# Patient Record
Sex: Male | Born: 1955
Health system: Southern US, Community
[De-identification: ages and names within clinical notes are randomized; demographics above are authoritative.]

## PROBLEM LIST (undated history)

## (undated) DIAGNOSIS — E119 Type 2 diabetes mellitus without complications: Secondary | ICD-10-CM

## (undated) DIAGNOSIS — E785 Hyperlipidemia, unspecified: Secondary | ICD-10-CM

## (undated) DIAGNOSIS — R011 Cardiac murmur, unspecified: Secondary | ICD-10-CM

## (undated) DIAGNOSIS — F419 Anxiety disorder, unspecified: Secondary | ICD-10-CM

## (undated) DIAGNOSIS — I10 Essential (primary) hypertension: Secondary | ICD-10-CM

## (undated) HISTORY — DX: Cardiac murmur, unspecified: R01.1

## (undated) HISTORY — DX: Type 2 diabetes mellitus without complications: E11.9

## (undated) HISTORY — DX: Anxiety disorder, unspecified: F41.9

## (undated) HISTORY — DX: Hyperlipidemia, unspecified: E78.5

## (undated) HISTORY — PX: TONSILLECTOMY: SUR1361

## (undated) HISTORY — DX: Essential (primary) hypertension: I10

---

## 2012-03-30 ENCOUNTER — Observation Stay (HOSPITAL_COMMUNITY)
Admission: EM | Admit: 2012-03-30 | Discharge: 2012-03-31 | Disposition: A | Discharge status: Home / Self Care | Payer: 59 | Attending: General Surgery | Admitting: General Surgery

## 2012-03-30 ENCOUNTER — Emergency Department (HOSPITAL_COMMUNITY): Payer: 59 | Admitting: Anesthesiology

## 2012-03-30 ENCOUNTER — Ambulatory Visit (HOSPITAL_COMMUNITY)
Admission: RE | Admit: 2012-03-30 | Discharge: 2012-03-30 | Disposition: A | Discharge status: Home / Self Care | Payer: 59 | Source: Ambulatory Visit | Attending: Family Medicine | Admitting: Family Medicine

## 2012-03-30 ENCOUNTER — Encounter (HOSPITAL_COMMUNITY): Payer: Self-pay | Admitting: Anesthesiology

## 2012-03-30 ENCOUNTER — Encounter (HOSPITAL_COMMUNITY): Payer: Self-pay | Admitting: Emergency Medicine

## 2012-03-30 ENCOUNTER — Emergency Department (HOSPITAL_COMMUNITY): Payer: 59

## 2012-03-30 ENCOUNTER — Encounter (HOSPITAL_COMMUNITY)
Admission: EM | Disposition: A | Discharge status: Home / Self Care | Payer: Self-pay | Source: Home / Self Care | Attending: Emergency Medicine

## 2012-03-30 ENCOUNTER — Ambulatory Visit (INDEPENDENT_AMBULATORY_CARE_PROVIDER_SITE_OTHER): Payer: 59 | Admitting: Family Medicine

## 2012-03-30 DIAGNOSIS — R109 Unspecified abdominal pain: Secondary | ICD-10-CM | POA: Insufficient documentation

## 2012-03-30 DIAGNOSIS — I10 Essential (primary) hypertension: Secondary | ICD-10-CM | POA: Insufficient documentation

## 2012-03-30 DIAGNOSIS — K625 Hemorrhage of anus and rectum: Secondary | ICD-10-CM

## 2012-03-30 DIAGNOSIS — K3580 Unspecified acute appendicitis: Principal | ICD-10-CM | POA: Insufficient documentation

## 2012-03-30 DIAGNOSIS — R509 Fever, unspecified: Secondary | ICD-10-CM | POA: Insufficient documentation

## 2012-03-30 HISTORY — PX: APPENDECTOMY: SHX54

## 2012-03-30 HISTORY — PX: LAPAROSCOPIC APPENDECTOMY: SHX408

## 2012-03-30 LAB — COMPREHENSIVE METABOLIC PANEL
Albumin: 4.2 g/dL (ref 3.5–5.2)
Alkaline Phosphatase: 83 U/L (ref 39–117)
BUN: 16 mg/dL (ref 6–23)
CO2: 27 mEq/L (ref 19–32)
Glucose, Bld: 163 mg/dL — ABNORMAL HIGH (ref 70–99)
Potassium: 3.4 mEq/L — ABNORMAL LOW (ref 3.5–5.3)

## 2012-03-30 LAB — POCT CBC
Granulocyte percent: 62.4 % (ref 37–80)
HCT, POC: 48.8 % (ref 43.5–53.7)
Hemoglobin: 15.9 g/dL (ref 14.1–18.1)
Lymph, poc: 3.7 — AB (ref 0.6–3.4)
MCH, POC: 30.5 pg (ref 27–31.2)
MCHC: 32.6 g/dL (ref 31.8–35.4)
MCV: 93.4 fL (ref 80–97)
MID (cbc): 0.7 (ref 0–0.9)
MPV: 9.9 fL (ref 0–99.8)
POC Granulocyte: 7.3 — AB (ref 2–6.9)
POC LYMPH PERCENT: 31.8 %L (ref 10–50)
POC MID %: 5.8 % (ref 0–12)
Platelet Count, POC: 230 10*3/uL (ref 142–424)
RBC: 5.22 M/uL (ref 4.69–6.13)
RDW, POC: 14.7 %
WBC: 11.7 10*3/uL — AB (ref 4.6–10.2)

## 2012-03-30 LAB — COMPREHENSIVE METABOLIC PANEL WITH GFR
ALT: 19 U/L (ref 0–53)
AST: 23 U/L (ref 0–37)
Calcium: 9 mg/dL (ref 8.4–10.5)
Chloride: 101 meq/L (ref 96–112)
Creat: 1.27 mg/dL (ref 0.50–1.35)
Sodium: 138 meq/L (ref 135–145)
Total Bilirubin: 0.8 mg/dL (ref 0.3–1.2)
Total Protein: 7.1 g/dL (ref 6.0–8.3)

## 2012-03-30 LAB — IFOBT (OCCULT BLOOD): IFOBT: NEGATIVE

## 2012-03-30 SURGERY — Surgical Case
Anesthesia: *Unknown

## 2012-03-30 SURGERY — APPENDECTOMY, LAPAROSCOPIC
Anesthesia: General | Site: Abdomen | Wound class: Contaminated

## 2012-03-30 MED ORDER — ONDANSETRON HCL 4 MG/2ML IJ SOLN
INTRAMUSCULAR | Status: DC | PRN
  Administered 2012-03-30: 4 mg via INTRAVENOUS

## 2012-03-30 MED ORDER — ONDANSETRON HCL 4 MG/2ML IJ SOLN: 4.0000 mg | Freq: Four times a day (QID) | INTRAMUSCULAR | Status: DC | PRN

## 2012-03-30 MED ORDER — MIDAZOLAM HCL 5 MG/5ML IJ SOLN
INTRAMUSCULAR | Status: DC | PRN
  Administered 2012-03-30: 2 mg via INTRAVENOUS

## 2012-03-30 MED ORDER — SUCCINYLCHOLINE CHLORIDE 20 MG/ML IJ SOLN
INTRAMUSCULAR | Status: DC | PRN
  Administered 2012-03-30: 100 mg via INTRAVENOUS

## 2012-03-30 MED ORDER — LISINOPRIL-HYDROCHLOROTHIAZIDE 10-12.5 MG PO TABS: 1.0000 | ORAL_TABLET | Freq: Every day | ORAL | Status: DC

## 2012-03-30 MED ORDER — HYDROMORPHONE HCL PF 1 MG/ML IJ SOLN: 0.5000 mg | INTRAMUSCULAR | Status: DC | PRN

## 2012-03-30 MED ORDER — GLYCOPYRROLATE 0.2 MG/ML IJ SOLN
INTRAMUSCULAR | Status: DC | PRN
  Administered 2012-03-30: .8 mg via INTRAVENOUS

## 2012-03-30 MED ORDER — IOHEXOL 300 MG/ML  SOLN: 20.0000 mL | INTRAMUSCULAR | Status: AC

## 2012-03-30 MED ORDER — EPHEDRINE SULFATE 50 MG/ML IJ SOLN
INTRAMUSCULAR | Status: DC | PRN
  Administered 2012-03-30: 5 mg via INTRAVENOUS
  Administered 2012-03-30: 10 mg via INTRAVENOUS

## 2012-03-30 MED ORDER — IOHEXOL 300 MG/ML  SOLN
100.0000 mL | Freq: Once | INTRAMUSCULAR | Status: AC | PRN
  Administered 2012-03-30: 100 mL via INTRAVENOUS

## 2012-03-30 MED ORDER — MORPHINE SULFATE 4 MG/ML IJ SOLN: 0.0500 mg/kg | INTRAMUSCULAR | Status: DC | PRN

## 2012-03-30 MED ORDER — OXYCODONE-ACETAMINOPHEN 5-325 MG PO TABS: 1.0000 | ORAL_TABLET | ORAL | Status: DC | PRN

## 2012-03-30 MED ORDER — SODIUM CHLORIDE 0.9 % IR SOLN
Status: DC | PRN
  Administered 2012-03-30: 1000 mL

## 2012-03-30 MED ORDER — ACETAMINOPHEN 325 MG PO TABS: 650.0000 mg | ORAL_TABLET | ORAL | Status: DC | PRN

## 2012-03-30 MED ORDER — ENOXAPARIN SODIUM 40 MG/0.4ML ~~LOC~~ SOLN
40.0000 mg | SUBCUTANEOUS | Status: DC
  Filled 2012-03-30: qty 0.4

## 2012-03-30 MED ORDER — LACTATED RINGERS IV SOLN
INTRAVENOUS | Status: DC | PRN
  Administered 2012-03-30: 18:00:00 via INTRAVENOUS

## 2012-03-30 MED ORDER — SODIUM CHLORIDE 0.9 % IV SOLN
INTRAVENOUS | Status: DC | PRN
  Administered 2012-03-30 (×2): via INTRAVENOUS

## 2012-03-30 MED ORDER — FENTANYL CITRATE 0.05 MG/ML IJ SOLN
INTRAMUSCULAR | Status: DC | PRN
  Administered 2012-03-30: 100 ug via INTRAVENOUS

## 2012-03-30 MED ORDER — SODIUM CHLORIDE 0.9 % IV SOLN
1.0000 g | Freq: Once | INTRAVENOUS | Status: AC
  Administered 2012-03-30: 1 g via INTRAVENOUS
  Filled 2012-03-30: qty 1

## 2012-03-30 MED ORDER — PROPOFOL 10 MG/ML IV BOLUS
INTRAVENOUS | Status: DC | PRN
  Administered 2012-03-30: 160 mg via INTRAVENOUS

## 2012-03-30 MED ORDER — BUPIVACAINE-EPINEPHRINE 0.25% -1:200000 IJ SOLN
INTRAMUSCULAR | Status: DC | PRN
  Administered 2012-03-30: 14 mL

## 2012-03-30 MED ORDER — NEOSTIGMINE METHYLSULFATE 1 MG/ML IJ SOLN
INTRAMUSCULAR | Status: DC | PRN
  Administered 2012-03-30: 5 mg via INTRAVENOUS

## 2012-03-30 MED ORDER — ONDANSETRON HCL 4 MG PO TABS: 4.0000 mg | ORAL_TABLET | Freq: Four times a day (QID) | ORAL | Status: DC | PRN

## 2012-03-30 MED ORDER — KCL IN DEXTROSE-NACL 20-5-0.45 MEQ/L-%-% IV SOLN
INTRAVENOUS | Status: DC
  Administered 2012-03-30: 21:00:00 via INTRAVENOUS
  Filled 2012-03-30 (×3): qty 1000

## 2012-03-30 MED ORDER — HYDROMORPHONE HCL PF 1 MG/ML IJ SOLN
0.2500 mg | INTRAMUSCULAR | Status: DC | PRN
  Administered 2012-03-30 (×2): 0.5 mg via INTRAVENOUS

## 2012-03-30 MED ORDER — ROCURONIUM BROMIDE 100 MG/10ML IV SOLN
INTRAVENOUS | Status: DC | PRN
  Administered 2012-03-30: 40 mg via INTRAVENOUS

## 2012-03-30 SURGICAL SUPPLY — 49 items
APPLIER CLIP ROT 10 11.4 M/L (STAPLE)
BLADE SURG ROTATE 9660 (MISCELLANEOUS) IMPLANT
CANISTER SUCTION 2500CC (MISCELLANEOUS) ×2 IMPLANT
CHLORAPREP W/TINT 26ML (MISCELLANEOUS) ×2 IMPLANT
CLIP APPLIE ROT 10 11.4 M/L (STAPLE) IMPLANT
CLOTH BEACON ORANGE TIMEOUT ST (SAFETY) ×2 IMPLANT
COVER SURGICAL LIGHT HANDLE (MISCELLANEOUS) ×2 IMPLANT
CUTTER FLEX LINEAR 45M (STAPLE) ×2 IMPLANT
CUTTER LINEAR ENDO 35 ETS (STAPLE) IMPLANT
CUTTER LINEAR ENDO 35 ETS TH (STAPLE) IMPLANT
DECANTER SPIKE VIAL GLASS SM (MISCELLANEOUS) IMPLANT
DERMABOND ADHESIVE PROPEN (GAUZE/BANDAGES/DRESSINGS) ×1
DERMABOND ADVANCED (GAUZE/BANDAGES/DRESSINGS) ×1
DERMABOND ADVANCED .7 DNX12 (GAUZE/BANDAGES/DRESSINGS) ×1 IMPLANT
DERMABOND ADVANCED .7 DNX6 (GAUZE/BANDAGES/DRESSINGS) ×1 IMPLANT
DRAPE UTILITY 15X26 W/TAPE STR (DRAPE) ×4 IMPLANT
DRSG TEGADERM 4X4.75 (GAUZE/BANDAGES/DRESSINGS) ×2 IMPLANT
ELECT REM PT RETURN 9FT ADLT (ELECTROSURGICAL) ×2
ELECTRODE REM PT RTRN 9FT ADLT (ELECTROSURGICAL) ×1 IMPLANT
ENDOLOOP SUT PDS II  0 18 (SUTURE)
ENDOLOOP SUT PDS II 0 18 (SUTURE) IMPLANT
GAUZE SPONGE 2X2 8PLY STRL LF (GAUZE/BANDAGES/DRESSINGS) ×1 IMPLANT
GLOVE BIOGEL PI IND STRL 8 (GLOVE) ×1 IMPLANT
GLOVE BIOGEL PI INDICATOR 8 (GLOVE) ×1
GLOVE ECLIPSE 7.5 STRL STRAW (GLOVE) ×2 IMPLANT
GOWN STRL NON-REIN LRG LVL3 (GOWN DISPOSABLE) ×4 IMPLANT
KIT BASIN OR (CUSTOM PROCEDURE TRAY) ×2 IMPLANT
KIT ROOM TURNOVER OR (KITS) ×2 IMPLANT
NS IRRIG 1000ML POUR BTL (IV SOLUTION) ×2 IMPLANT
PAD ARMBOARD 7.5X6 YLW CONV (MISCELLANEOUS) ×4 IMPLANT
PENCIL BUTTON HOLSTER BLD 10FT (ELECTRODE) ×2 IMPLANT
POUCH SPECIMEN RETRIEVAL 10MM (ENDOMECHANICALS) ×2 IMPLANT
RELOAD /EVU35 (ENDOMECHANICALS) IMPLANT
RELOAD 45 VASCULAR/THIN (ENDOMECHANICALS) ×2 IMPLANT
RELOAD CUTTER ETS 35MM STAND (ENDOMECHANICALS) IMPLANT
RELOAD STAPLE TA45 3.5 REG BLU (ENDOMECHANICALS) ×2 IMPLANT
SET IRRIG TUBING LAPAROSCOPIC (IRRIGATION / IRRIGATOR) ×2 IMPLANT
SPECIMEN JAR SMALL (MISCELLANEOUS) ×2 IMPLANT
SPONGE GAUZE 2X2 STER 10/PKG (GAUZE/BANDAGES/DRESSINGS) ×1
STRIP CLOSURE SKIN 1/2X4 (GAUZE/BANDAGES/DRESSINGS) ×2 IMPLANT
SUT MNCRL AB 4-0 PS2 18 (SUTURE) ×2 IMPLANT
TOWEL OR 17X24 6PK STRL BLUE (TOWEL DISPOSABLE) ×2 IMPLANT
TOWEL OR 17X26 10 PK STRL BLUE (TOWEL DISPOSABLE) ×2 IMPLANT
TRAY FOLEY CATH 14FR (SET/KITS/TRAYS/PACK) ×2 IMPLANT
TRAY LAPAROSCOPIC (CUSTOM PROCEDURE TRAY) ×2 IMPLANT
TROCAR XCEL 12X100 BLDLESS (ENDOMECHANICALS) ×2 IMPLANT
TROCAR XCEL BLUNT TIP 100MML (ENDOMECHANICALS) ×2 IMPLANT
TROCAR XCEL NON-BLD 5MMX100MML (ENDOMECHANICALS) ×2 IMPLANT
WATER STERILE IRR 1000ML POUR (IV SOLUTION) IMPLANT

## 2012-03-30 NOTE — Anesthesia Preprocedure Evaluation (Addendum)
Anesthesia Evaluation  Patient identified by MRN, date of birth, ID band  Reviewed: Allergy & Precautions, H&P , NPO status , Patient's Chart, lab work & pertinent test results  Airway Mallampati: II      Dental   Pulmonary neg pulmonary ROS,          Cardiovascular hypertension, Pt. on medications + Valvular Problems/Murmurs Rhythm:Regular Rate:Normal + Systolic murmurs Followed in past by [facility]. Hx of heart murmur and HTN dx as per patient two years ago. Patient reports medical treatment and cardiology referral. Cardiology referral not done per patient. Patient denies SOB and chest pain.  Case discussed with patient and Dr. Lindie Spruce.   Neuro/Psych negative neurological ROS     GI/Hepatic Neg liver ROS,   Endo/Other  negative endocrine ROS  Renal/GU negative Renal ROS     Musculoskeletal negative musculoskeletal ROS (+)   Abdominal   Peds  Hematology negative hematology ROS (+)   Anesthesia Other Findings   Reproductive/Obstetrics                         Anesthesia Physical Anesthesia Plan  ASA: II  Anesthesia Plan: General   Post-op Pain Management:    Induction: Intravenous, Rapid sequence and Cricoid pressure planned  Airway Management Planned: Oral ETT  Additional Equipment:   Intra-op Plan:   Post-operative Plan: Extubation in OR  Informed Consent:   Plan Discussed with: CRNA  Anesthesia Plan Comments:         Anesthesia Quick Evaluation

## 2012-03-30 NOTE — ED Notes (Signed)
Patient advises that he last ate around 5:30am.

## 2012-03-30 NOTE — Op Note (Signed)
OPERATIVE REPORT  DATE OF OPERATION: 03/30/2012  PATIENT:  Dellie Catholic  56 y.o. male  PRE-OPERATIVE DIAGNOSIS:  Acute Appendicitis  POST-OPERATIVE DIAGNOSIS:  Acute Appendicitis with gangrenous necrosis  PROCEDURE:  Procedure(s): APPENDECTOMY LAPAROSCOPIC  SURGEON:  Surgeon(s): Cherylynn Ridges, MD  ASSISTANT: None  ANESTHESIA:   general  EBL: <20 ml  BLOOD ADMINISTERED: none  DRAINS: none   SPECIMEN:  Source of Specimen:  appendix  COUNTS CORRECT:  YES  PROCEDURE DETAILS: The patient was taken to the operating room and placed on the table in the supine position. After an adequate endotracheal anesthetic was administered she was prepped and draped in usual sterile manner exposing his entire abdomen.  After a proper time out was performed identifying the patient and the procedure be performed a supraumbilical transverse curvilinear incision was made using #15 blade.  The patient had and umbilical hernia defect which was dissected clear, the sac was resected, a 0 Vicryl pursestring was place around the defect that was subsequently used as access for the [facility] cannula. It was through the [facility] cannula and the carbon dioxide gas was insufflated into the peritoneal cavity up to a maximal intra-abdominal pressure of 15 mm of mercury.  Once this was done a right upper quadrant 5 mm cannula and the left lower quadrant 12 mm cannula passed under direct vision the patient was placed in Trendelenburg left side was tilted down.  The patient had some adhesions of the cecum and the retrocecal appendix to the lateral and anterior abdominal wall.  This was dissected away bluntly. The appendix was rotated laterally and retroperitoneal. We were able to dissect from the lateral and posterior wall and then come across the base using an Endo GIA blue 3.5 mm  stapler.  The mesoappendix was taken with a white 2.5 mm Endo GIA. This completed attached appendix and the 2 portions were removed by Endo  Catch bag.  There was excellent hemostasis. We irrigated with saline solution and removed all cannulas tying off the supraumbilical fascia/hernia using the pursestring suture which held in the [place] cannula..  Once all cannulas were removed we injected all sites with quarter percent Marcaine with epinephrine. Once that was done the left lower quadrant and supraumbilical skin sites were closed using running subcuticular stitch of 4-0 Monocryl. Once that was done Dermabond Steri-Strips and Tegaderms use complete our dressings. All needle counts sponge counts and instrument counts were correct    PATIENT DISPOSITION:  PACU - hemodynamically stable.   Kaisyn Millea III,Javeon Macmurray O 5/11/20136:27 PM

## 2012-03-30 NOTE — ED Notes (Signed)
Pt. Stated, I was dx with appendicitis after going to xray.

## 2012-03-30 NOTE — Transfer of Care (Signed)
Immediate Anesthesia Transfer of Care Note  Patient: Michael Stephenson  Procedure(s) Performed: Procedure(s) (LRB): APPENDECTOMY LAPAROSCOPIC (N/A)  Patient Location: PACU  Anesthesia Type: General  Level of Consciousness: sedated  Airway & Oxygen Therapy: Patient connected to nasal cannula oxygen  Post-op Assessment: Report given to PACU RN, Post -op Vital signs reviewed and stable and Patient moving all extremities X 4  Post vital signs: Reviewed and stable  Complications: No apparent anesthesia complications

## 2012-03-30 NOTE — Progress Notes (Addendum)
[facility]:  Office Visit  Chief Complaint:  Chief Complaint  Patient presents with  . Abdominal Pain    since last night one week ago bloody stool     HPI: Michael Stephenson is a 56 y.o. male who complains of  1 day history of acute right lower quadrant abd pain, subjective fevers/chills. Not asscoiated with food but has had poor intake. He has increase pain with minimal movement. 1 week ago he had BRBPR in toilet bowel and has had intermittent black stools.  Denies any h/o IBD, colon cancer. He has had no abdominla surgeries, still has appendix. NO h/o constipation. BM one day ago, + flatus.   Past Medical History  Diagnosis Date  . Hypertension    History reviewed. No pertinent past surgical history. History   Social History  . Marital Status: Divorced    Spouse Name: N/A    Number of Children: N/A  . Years of Education: N/A   Social History Main Topics  . Smoking status: Former Games developer  . Smokeless tobacco: None  . Alcohol Use: None  . Drug Use: None  . Sexually Active: None   Other Topics Concern  . None   Social History Narrative  . None   Family History  Problem Relation Age of Onset  . Cancer Father    No Known Allergies Prior to Admission medications   Not on File     ROS: The patient denies fevers, chills, night sweats, unintentional weight loss, chest pain, palpitations, wheezing, dyspnea on exertion, nausea, vomiting, dysuria, hematuria, numbness, weakness, or tingling.  + abdominal pain, + melena All other systems have been reviewed and were otherwise negative with the exception of those mentioned in the HPI and as above.    PHYSICAL EXAM: Filed Vitals:   03/30/12 0845  BP: 182/103  Pulse: 82  Temp: 100 F (37.8 C)  Resp: 16   Filed Vitals:   03/30/12 0845  Height: 5\' 9"  (1.753 m)  Weight: 209 lb (94.802 kg)   Body mass index is 30.86 kg/(m^2).  General: Alert, no acute distress HEENT:  Normocephalic, atraumatic,  oropharynx patent.  Cardiovascular:  Regular rate and rhythm, no rubs murmurs or gallops.  No Carotid bruits, radial pulse intact. No pedal edema.  Respiratory: Clear to auscultation bilaterally.  No wheezes, rales, or rhonchi.  No cyanosis, no use of accessory musculature GI: No organomegaly, abdomen is soft and + RLQ tender, no guarding, no peritoneal sign, positive bowel sounds.  No masses. Skin: No rashes. Neurologic: Facial musculature symmetric. Psychiatric: Patient is appropriate throughout our interaction. Lymphatic: No cervical lymphadenopathy Musculoskeletal: Gait intact.   LABS: Results for orders placed in visit on 03/30/12  POCT CBC      Component Value Range   WBC 11.7 (*) 4.6 - 10.2 (K/uL)   Lymph, poc 3.7 (*) 0.6 - 3.4    POC LYMPH PERCENT 31.8  10 - 50 (%L)   MID (cbc) 0.7  0 - 0.9    POC MID % 5.8  0 - 12 (%M)   POC Granulocyte 7.3 (*) 2 - 6.9    Granulocyte percent 62.4  37 - 80 (%G)   RBC 5.22  4.69 - 6.13 (M/uL)   Hemoglobin 15.9  14.1 - 18.1 (g/dL)   HCT, POC 16.1  09.6 - 53.7 (%)   MCV 93.4  80 - 97 (fL)   MCH, POC 30.5  27 - 31.2 (pg)   MCHC 32.6  31.8 -  35.4 (g/dL)   RDW, POC 16.1     Platelet Count, POC 230  142 - 424 (K/uL)   MPV 9.9  0 - 99.8 (fL)  IFOBT (OCCULT BLOOD)      Component Value Range   IFOBT Negative       EKG/XRAY:   Primary read interpreted by Dr. Conley Rolls at [facility].   ASSESSMENT/PLAN: Encounter Diagnoses  Name Primary?  . Abdominal pain Yes  . Bright red blood per rectum   . HTN (hypertension)    1. Sent patient to get CT abd and pelvis to rule out appendicitis 2. Hemoccult negative but will send to GI after get CT results 3. Rx Lisinopril/HCTZ 10/12.5 mg daily, return in 3 days fro f/u with BP measurements. Advise SEs of meds.     Michael Gisler PHUONG, DO 03/30/2012 10:13 AM

## 2012-03-30 NOTE — Anesthesia Postprocedure Evaluation (Signed)
  Anesthesia Post-op Note  Patient: Michael Stephenson  Procedure(s) Performed: Procedure(s) (LRB): APPENDECTOMY LAPAROSCOPIC (N/A)  Patient Location: PACU  Anesthesia Type: General  Level of Consciousness: awake  Airway and Oxygen Therapy: Patient Spontanous Breathing  Post-op Pain: mild  Post-op Assessment: Post-op Vital signs reviewed  Post-op Vital Signs: Reviewed  Complications: No apparent anesthesia complications

## 2012-03-30 NOTE — ED Provider Notes (Signed)
History     CSN: [id number]  Arrival date & time 03/30/12  1329   First MD Initiated Contact with Patient 03/30/12 1405      Chief Complaint  Patient presents with  . Abdominal Pain    (Consider location/radiation/quality/duration/timing/severity/associated sxs/prior treatment) HPI Comments: Was seen at [facility], had ct confirming appendicitis, sent here for surgical consultation.  Patient is a 56 y.o. male presenting with abdominal pain. The history is provided by the patient.  Abdominal Pain The primary symptoms of the illness include abdominal pain and fever. The primary symptoms of the illness do not include nausea, vomiting, diarrhea or dysuria. The current episode started yesterday. The onset of the illness was gradual. The problem has been gradually worsening.  The patient has not had a change in bowel habit. Symptoms associated with the illness do not include chills.    Past Medical History  Diagnosis Date  . Hypertension     History reviewed. No pertinent past surgical history.  Family History  Problem Relation Age of Onset  . Cancer Father     History  Substance Use Topics  . Smoking status: Former Games developer  . Smokeless tobacco: Not on file  . Alcohol Use: No      Review of Systems  Constitutional: Positive for fever. Negative for chills.  Gastrointestinal: Positive for abdominal pain. Negative for nausea, vomiting and diarrhea.  Genitourinary: Negative for dysuria.  All other systems reviewed and are negative.    Allergies  Review of patient's allergies indicates no known allergies.  Home Medications   Current Outpatient Rx  Name Route Sig Dispense Refill  . BISMUTH SUBSALICYLATE 262 MG/15ML PO SUSP Oral Take 15 mLs by mouth every 6 (six) hours as needed. For indigestion      BP 181/102  Pulse 71  SpO2 98%  Physical Exam  Nursing note and vitals reviewed. Constitutional: He is oriented to person, place, and time. He appears well-developed  and well-nourished. No distress.  HENT:  Head: Normocephalic and atraumatic.  Neck: Normal range of motion. Neck supple.  Cardiovascular: Normal rate and regular rhythm.   No murmur heard. Pulmonary/Chest: Effort normal and breath sounds normal. No respiratory distress. He has no wheezes.  Abdominal: Soft. Bowel sounds are normal. He exhibits distension.       ttp in the rlq, no rebound or guarding.  Musculoskeletal: Normal range of motion. He exhibits no edema.  Neurological: He is alert and oriented to person, place, and time.  Skin: Skin is warm. He is not diaphoretic.    ED Course  Procedures (including critical care time)  Labs Reviewed - No data to display Ct Abdomen Pelvis W Contrast  03/30/2012  *RADIOLOGY REPORT*  Clinical Data: Lower abdominal pain, fever and bloody stools.  CT ABDOMEN AND PELVIS WITH CONTRAST  Technique:  Multidetector CT imaging of the abdomen and pelvis was performed following the standard protocol during bolus administration of intravenous contrast.  Contrast: OMNIPAQUE IOHEXOL 300 MG/ML  SOLN  Comparison: None.  Findings: There is evidence of acute appendicitis with significant inflammatory changes seen surrounding the appendix. A calcified appendicolith is present in the mid appendix and the distal appendix is dilated, measuring approximately 11 mm in diameter. Based on CT appearance, focal rupture of the appendix is likely. There is no evidence of focal abscess or extraluminal air.  Other bowel loops are unremarkable without evidence of obstruction or ileus.  The liver, gallbladder, pancreas, spleen, adrenal glands and kidneys are within normal limits.  No evidence of hernia.  The bladder is unremarkable.  No incidental masses or enlarged lymph nodes.  Bony structures are within normal limits.  IMPRESSION: Acute appendicitis with a calcified phlebolith present in the midportion of the appendix.  The distal appendix is dilated and significant surrounding  inflammatory changes are present.  This likely represents focal ruptured appendicitis.  There is no evidence of peri-appendiceal abscess.  Findings were called to Dr. Conley Rolls at the time of interpretation.  Original Report Authenticated By: Reola Calkins, M.D.     No diagnosis found.    MDM  He was given invanz and I have consulted Dr. Lindie Spruce who will take the patient to the OR.        Geoffery Lyons, MD 03/30/12 [phone]

## 2012-03-30 NOTE — Preoperative (Signed)
Beta Blockers   Reason not to administer Beta Blockers:Not Applicable 

## 2012-03-30 NOTE — Anesthesia Procedure Notes (Signed)
Procedure Name: Intubation Date/Time: 03/30/2012 5:17 PM Performed by: Molli Hazard Pre-anesthesia Checklist: Patient identified, Emergency Drugs available, Suction available and Patient being monitored Patient Re-evaluated:Patient Re-evaluated prior to inductionOxygen Delivery Method: Circle system utilized Preoxygenation: Pre-oxygenation with 100% oxygen Intubation Type: IV induction, Rapid sequence and Cricoid Pressure applied Laryngoscope Size: Miller and 2 Grade View: Grade I Tube type: Oral Tube size: 7.5 mm Number of attempts: 1 Airway Equipment and Method: Stylet Placement Confirmation: ETT inserted through vocal cords under direct vision,  positive ETCO2 and breath sounds checked- equal and bilateral Secured at: 24 cm Tube secured with: Tape Dental Injury: Teeth and Oropharynx as per pre-operative assessment

## 2012-03-30 NOTE — H&P (Signed)
Michael Stephenson is an 56 y.o. male.   Chief Complaint: Abdominal pain, sent over from urgent care. HPI: Generalized abdominal pain, localized to RLQ now, start Friday morning.  CT demonstrates acute appendicitis  Past Medical History  Diagnosis Date  . Hypertension     History reviewed. No pertinent past surgical history.  Family History  Problem Relation Age of Onset  . Cancer Father    Social History:  reports that he has quit smoking. He does not have any smokeless tobacco history on file. He reports that he does not drink alcohol or use illicit drugs.  Allergies: No Known Allergies   (Not in a hospital admission)  Results for orders placed in visit on 03/30/12 (from the past 48 hour(s))  POCT CBC     Status: Abnormal   Collection Time   03/30/12  9:34 AM      Component Value Range Comment   WBC 11.7 (*) 4.6 - 10.2 (K/uL)    Lymph, poc 3.7 (*) 0.6 - 3.4     POC LYMPH PERCENT 31.8  10 - 50 (%L)    MID (cbc) 0.7  0 - 0.9     POC MID % 5.8  0 - 12 (%M)    POC Granulocyte 7.3 (*) 2 - 6.9     Granulocyte percent 62.4  37 - 80 (%G)    RBC 5.22  4.69 - 6.13 (M/uL)    Hemoglobin 15.9  14.1 - 18.1 (g/dL)    HCT, POC 16.1  09.6 - 53.7 (%)    MCV 93.4  80 - 97 (fL)    MCH, POC 30.5  27 - 31.2 (pg)    MCHC 32.6  31.8 - 35.4 (g/dL)    RDW, POC 04.5      Platelet Count, POC 230  142 - 424 (K/uL)    MPV 9.9  0 - 99.8 (fL)   IFOBT (OCCULT BLOOD)     Status: Normal   Collection Time   03/30/12  9:34 AM      Component Value Range Comment   IFOBT Negative      Ct Abdomen Pelvis W Contrast  03/30/2012  *RADIOLOGY REPORT*  Clinical Data: Lower abdominal pain, fever and bloody stools.  CT ABDOMEN AND PELVIS WITH CONTRAST  Technique:  Multidetector CT imaging of the abdomen and pelvis was performed following the standard protocol during bolus administration of intravenous contrast.  Contrast: OMNIPAQUE IOHEXOL 300 MG/ML  SOLN  Comparison: None.  Findings: There is evidence of acute  appendicitis with significant inflammatory changes seen surrounding the appendix. A calcified appendicolith is present in the mid appendix and the distal appendix is dilated, measuring approximately 11 mm in diameter. Based on CT appearance, focal rupture of the appendix is likely. There is no evidence of focal abscess or extraluminal air.  Other bowel loops are unremarkable without evidence of obstruction or ileus.  The liver, gallbladder, pancreas, spleen, adrenal glands and kidneys are within normal limits.  No evidence of hernia.  The bladder is unremarkable.  No incidental masses or enlarged lymph nodes.  Bony structures are within normal limits.  IMPRESSION: Acute appendicitis with a calcified phlebolith present in the midportion of the appendix.  The distal appendix is dilated and significant surrounding inflammatory changes are present.  This likely represents focal ruptured appendicitis.  There is no evidence of peri-appendiceal abscess.  Findings were called to Dr. Conley Rolls at the time of interpretation.  Original Report Authenticated By: Reola Calkins,  M.D.    Review of Systems  Constitutional: Negative for fever and chills.  HENT: Negative.   Respiratory: Negative.   Cardiovascular: Negative.   Gastrointestinal: Positive for abdominal pain (generalized now in RLQ).  Genitourinary: Negative.   Musculoskeletal: Negative.   Skin: Negative.   Neurological: Negative.   Endo/Heme/Allergies: Negative.     Blood pressure 181/102, pulse 71, SpO2 98.00%. Physical Exam  Constitutional: He is oriented to person, place, and time.  HENT:  Head: Normocephalic and atraumatic.  Eyes: Conjunctivae and EOM are normal. Pupils are equal, round, and reactive to light.  Neck: Normal range of motion. Neck supple.  Cardiovascular: Normal rate, regular rhythm and normal heart sounds.   Respiratory: Effort normal and breath sounds normal.  GI: Soft. Bowel sounds are normal. There is tenderness in the right  lower quadrant. There is guarding and tenderness at McBurney's point. There is no rigidity, no rebound, no CVA tenderness and negative Murphy's sign.  Musculoskeletal: Normal range of motion.  Neurological: He is alert and oriented to person, place, and time. He has normal reflexes.  Skin: Skin is warm and dry.  Psychiatric: He has a normal mood and affect. His behavior is normal. Judgment and thought content normal.     Assessment/Plan Acute appendicitis clinically and radiologically.  Invanz IV preop Laparoscopic appendectomy after antibiotics.  Rilyn Scroggs III,Celena Lanius O 03/30/2012, 3:13 PM

## 2012-03-30 NOTE — ED Notes (Signed)
Patient advises that pain started having pain last pm. And has got worse through the night.

## 2012-03-30 NOTE — ED Provider Notes (Signed)
4:11 PM  Date: 03/30/2012  Rate: 69  Rhythm: normal sinus rhythm  QRS Axis: left  Intervals: normal  ST/T Wave abnormalities: normal  Conduction Disutrbances:left anterior fascicular block  Narrative Interpretation: Abnormal EKg  Old EKG Reviewed: none available    Carleene Cooper III, MD 03/30/12 [id number]

## 2012-03-30 NOTE — ED Notes (Signed)
Patient is alert and oriented , transferred to surgery via stretcher, with wife.

## 2012-03-31 ENCOUNTER — Encounter (HOSPITAL_COMMUNITY): Payer: Self-pay | Admitting: *Deleted

## 2012-03-31 MED ORDER — ATENOLOL 12.5 MG HALF TABLET
12.5000 mg | ORAL_TABLET | Freq: Two times a day (BID) | ORAL | Status: DC
Start: 2012-03-31 — End: 2012-03-31
  Administered 2012-03-31: 12.5 mg via ORAL
  Filled 2012-03-31 (×2): qty 1

## 2012-03-31 MED ORDER — ATENOLOL 12.5 MG HALF TABLET: 12.5000 mg | ORAL_TABLET | Freq: Two times a day (BID) | ORAL | Status: DC

## 2012-03-31 MED ORDER — OXYCODONE-ACETAMINOPHEN 5-325 MG PO TABS: 1.0000 | ORAL_TABLET | ORAL | Status: AC | PRN

## 2012-03-31 NOTE — Progress Notes (Signed)
Dc instructions gone over with patient. Prescription given. Home meds gone over, follow up appointment to be made. Copy of instructions given to patient.

## 2012-03-31 NOTE — Progress Notes (Signed)
1 Day Post-Op  Subjective: Doing well  Objective: Vital signs in last 24 hours: Temp:  [96.8 F (36 C)-99.7 F (37.6 C)] 99.7 F (37.6 C) (05/12 0644) Pulse Rate:  [67-84] 79  (05/12 0644) Resp:  [16-30] 18  (05/12 0644) BP: (127-181)/(68-111) 149/79 mmHg (05/12 0644) SpO2:  [93 %-99 %] 97 % (05/12 0644) Weight:  [209 lb (94.802 kg)] 209 lb (94.802 kg) (05/11 1920) Last BM Date: 03/29/12  Intake/Output from previous day: 05/11 0701 - 05/12 0700 In: 1620 [P.O.:720; I.V.:900] Out: 1575 [Urine:1575] Intake/Output this shift: Total I/O In: 791.3 [I.V.:791.3] Out: 200 [Urine:200]  Incision/Wound:abdomen soft nontender   Wound clean dry intact  Lab Results:   Basename 03/30/12 0934  WBC 11.7*  HGB 15.9  HCT 48.8  PLT --   BMET  Basename 03/30/12 0927  NA 138  K 3.4*  CL 101  CO2 27  GLUCOSE 163*  BUN 16  CREATININE 1.27  CALCIUM 9.0   PT/INR No results found for this basename: LABPROT:2,INR:2 in the last 72 hours ABG No results found for this basename: PHART:2,PCO2:2,PO2:2,HCO3:2 in the last 72 hours  Studies/Results: Chest 2 View  03/30/2012  *RADIOLOGY REPORT*  Clinical Data: Preop for appendectomy.  CHEST - 2 VIEW  Comparison: None.  Findings: The heart size is normal.  The lungs are clear.  The visualized soft tissues and bony thorax are unremarkable.  IMPRESSION: Negative chest.  Original Report Authenticated By: Jamesetta Orleans. MATTERN, M.D.   Ct Abdomen Pelvis W Contrast  03/30/2012  *RADIOLOGY REPORT*  Clinical Data: Lower abdominal pain, fever and bloody stools.  CT ABDOMEN AND PELVIS WITH CONTRAST  Technique:  Multidetector CT imaging of the abdomen and pelvis was performed following the standard protocol during bolus administration of intravenous contrast.  Contrast: OMNIPAQUE IOHEXOL 300 MG/ML  SOLN  Comparison: None.  Findings: There is evidence of acute appendicitis with significant inflammatory changes seen surrounding the appendix. A calcified  appendicolith is present in the mid appendix and the distal appendix is dilated, measuring approximately 11 mm in diameter. Based on CT appearance, focal rupture of the appendix is likely. There is no evidence of focal abscess or extraluminal air.  Other bowel loops are unremarkable without evidence of obstruction or ileus.  The liver, gallbladder, pancreas, spleen, adrenal glands and kidneys are within normal limits.  No evidence of hernia.  The bladder is unremarkable.  No incidental masses or enlarged lymph nodes.  Bony structures are within normal limits.  IMPRESSION: Acute appendicitis with a calcified phlebolith present in the midportion of the appendix.  The distal appendix is dilated and significant surrounding inflammatory changes are present.  This likely represents focal ruptured appendicitis.  There is no evidence of peri-appendiceal abscess.  Findings were called to Dr. Conley Rolls at the time of interpretation.  Original Report Authenticated By: Reola Calkins, M.D.    Anti-infectives: Anti-infectives     Start     Dose/Rate Route Frequency Ordered Stop   03/30/12 1500   ertapenem (INVANZ) 1 g in sodium chloride 0.9 % 50 mL IVPB        1 g 100 mL/hr over 30 Minutes Intravenous  Once 03/30/12 1411 03/30/12 1532          Assessment/Plan: s/p Procedure(s) (LRB): APPENDECTOMY LAPAROSCOPIC (N/A) History of essential hypertension Add atenolol 12.5 mg po bid and make outpatient referral for follow up.  Discharge  LOS: 1 day    Michael Stephenson A. 03/31/2012

## 2012-03-31 NOTE — Discharge Instructions (Signed)
Laparoscopic Appendectomy Care After Refer to this sheet in the next few weeks. These instructions provide you with information on caring for yourself after your procedure. Your caregiver may also give you more specific instructions. Your treatment has been planned according to current medical practices, but problems sometimes occur. Call your caregiver if you have any problems or questions after your procedure. HOME CARE INSTRUCTIONS  Do not drive while taking narcotic pain medicines.   Use stool softener if you become constipated from your pain medicines.   Change your bandages (dressings) as directed.   Keep your wounds clean and dry. You may wash the wounds gently with soap and water. Gently pat the wounds dry with a clean towel.   Do not take baths, swim, or use hot tubs for 10 days, or as instructed by your caregiver.   Only take over-the-counter or prescription medicines for pain, discomfort, or fever as directed by your caregiver.   You may continue your normal diet as directed.   Do not lift more than 10 pounds (4.5 kg) or play contact sports for 3 weeks, or as directed.   Slowly increase your activity after surgery.   Take deep breaths to avoid getting a lung infection (pneumonia).  SEEK MEDICAL CARE IF:  You have redness, swelling, or increasing pain in your wounds.   You have pus coming from your wounds.   You have drainage from a wound that lasts longer than 1 day.   You notice a bad smell coming from the wounds or dressing.   Your wound edges break open after stitches (sutures) have been removed.   You notice increasing pain in the shoulders (shoulder strap areas) or near your shoulder blades.   You develop dizzy episodes or fainting while standing.   You develop shortness of breath.   You develop persistent nausea or vomiting.   You cannot control your bowel functions or lose your appetite.   You develop diarrhea.  SEEK IMMEDIATE MEDICAL CARE IF:   You  have a fever.   You develop a rash.   You have difficulty breathing or sharp pains in your chest.   You develop any reaction or side effects to medicines given.  MAKE SURE YOU:  Understand these instructions.   Will watch your condition.   Will get help right away if you are not doing well or get worse.  Document Released: 11/06/2005 Document Revised: 10/26/2011 Document Reviewed: 05/16/2011 Exit[employer] Patient Information 2012 [employer]. 

## 2012-03-31 NOTE — Discharge Summary (Signed)
Physician Discharge Summary  Patient ID: Michael Stephenson MRN: [id number] DOB/AGE: 1956-05-28 56 y.o.  Admit date: 03/30/2012 Discharge date: 03/31/2012  Admission Diagnoses: acute appendicitis.  Discharge Diagnoses: same  Hypertension Active Problems:  * No active hospital problems. *    Discharged Condition: good  Hospital Course: Pt underwent laparoscopic appendectomy without complication.  Pt had significant hypertension post op and has a history of untreated hypertension.  He was placed on atenolol 12.5 mg bid.  He has no history of cardiac disease or COPD.  He was tolerating diet and doing well with stable vital signs at discharge.    Consults: None  Significant Diagnostic Studies: CT abdomen  Treatments: surgery: lap appendectomy  Discharge Exam: Blood pressure 149/79, pulse 79, temperature 99.7 F (37.6 C), temperature source Oral, resp. rate 18, height 5\' 9"  (1.753 m), weight 209 lb (94.802 kg), SpO2 97.00%. General appearance: alert and cooperative Incision/Wound:soft non tender abdomen.  Wounds clean dry intact  Disposition: Final discharge disposition not confirmed  Discharge Orders    Future Orders Please Complete By Expires   Diet - low sodium heart healthy      Increase activity slowly      Driving Restrictions      Comments:   Off pain meds and comfortable.   Lifting restrictions      Comments:   No lifting greater than 15 lbs for 1 week   Discharge instructions      Comments:   Office will call t set up outpatient follow up and medical referral for hypertension. Shower tomorrow and remove dressing and leave uncovered     Medication List  As of 03/31/2012 10:48 AM   STOP taking these medications         bismuth subsalicylate 262 MG/15ML suspension         TAKE these medications         atenolol 12.5 mg Tabs   Commonly known as: TENORMIN   Take 0.5 tablets (12.5 mg total) by mouth 2 (two) times daily.      oxyCODONE-acetaminophen 5-325 MG per  tablet   Commonly known as: PERCOCET   Take 1-2 tablets by mouth every 4 (four) hours as needed.             Signed: Ariyana Faw A. 03/31/2012, 10:48 AM

## 2012-04-01 ENCOUNTER — Encounter (HOSPITAL_COMMUNITY): Payer: Self-pay | Admitting: General Surgery

## 2012-04-16 ENCOUNTER — Encounter (INDEPENDENT_AMBULATORY_CARE_PROVIDER_SITE_OTHER): Payer: Self-pay | Admitting: General Surgery

## 2012-04-16 ENCOUNTER — Other Ambulatory Visit (INDEPENDENT_AMBULATORY_CARE_PROVIDER_SITE_OTHER): Payer: Self-pay

## 2012-04-16 ENCOUNTER — Ambulatory Visit (INDEPENDENT_AMBULATORY_CARE_PROVIDER_SITE_OTHER): Payer: 59 | Admitting: General Surgery

## 2012-04-16 VITALS — BP 150/102 | HR 66 | Temp 97.7°F | Ht 68.0 in | Wt 207.0 lb

## 2012-04-16 DIAGNOSIS — Z09 Encounter for follow-up examination after completed treatment for conditions other than malignant neoplasm: Secondary | ICD-10-CM | POA: Insufficient documentation

## 2012-04-16 DIAGNOSIS — I10 Essential (primary) hypertension: Secondary | ICD-10-CM | POA: Insufficient documentation

## 2012-04-16 NOTE — Progress Notes (Signed)
HPI The patient is doing well status post laparoscopic appendectomy. However his blood pressure is still elevated. He was started on new blood pressure medicines when he came in the hospital and today he is 150/102.  PE His wounds have healed well with no evidence of infection. He has good bowel sounds and is eating well.  Studiy review Currently there are no studies to review.  Assessment Doing well status post laparoscopic appendectomy however she has hypertension which needs to be controlled. He was discharged home on atenolol and also a combination of lisinopril and hydrochlorothiazide. In spite of that his blood pressure is still elevated.  Plan Refer him to a primary care physician for followup and treatment of his hypertension. He returned to work Advertising account executive. Followup with me is on a p.r.n. basis.

## 2012-05-25 ENCOUNTER — Other Ambulatory Visit (HOSPITAL_COMMUNITY): Payer: Self-pay | Admitting: Surgery

## 2012-05-25 ENCOUNTER — Other Ambulatory Visit: Payer: Self-pay | Admitting: Family Medicine

## 2012-07-30 ENCOUNTER — Other Ambulatory Visit: Payer: Self-pay | Admitting: Physician Assistant

## 2012-07-31 MED ORDER — LISINOPRIL-HYDROCHLOROTHIAZIDE 10-12.5 MG PO TABS: 1.0000 | ORAL_TABLET | Freq: Every day | ORAL | Status: DC

## 2012-07-31 NOTE — Addendum Note (Signed)
Addended by: Jacqualyn Posey on: 07/31/2012 10:04 AM   Modules accepted: Orders

## 2012-08-01 ENCOUNTER — Other Ambulatory Visit: Payer: Self-pay | Admitting: Physician Assistant

## 2013-04-10 ENCOUNTER — Encounter (HOSPITAL_COMMUNITY): Payer: Self-pay | Admitting: *Deleted

## 2013-04-10 ENCOUNTER — Emergency Department (INDEPENDENT_AMBULATORY_CARE_PROVIDER_SITE_OTHER)
Admission: EM | Admit: 2013-04-10 | Discharge: 2013-04-10 | Disposition: A | Discharge status: Home / Self Care | Payer: 59 | Source: Home / Self Care

## 2013-04-10 DIAGNOSIS — I10 Essential (primary) hypertension: Secondary | ICD-10-CM

## 2013-04-10 DIAGNOSIS — N4889 Other specified disorders of penis: Secondary | ICD-10-CM

## 2013-04-10 LAB — POCT I-STAT, CHEM 8
BUN: 15 mg/dL (ref 6–23)
Calcium, Ion: 1.15 mmol/L (ref 1.12–1.23)
Chloride: 105 mEq/L (ref 96–112)
Creatinine, Ser: 1.1 mg/dL (ref 0.50–1.35)
Glucose, Bld: 134 mg/dL — ABNORMAL HIGH (ref 70–99)
TCO2: 27 mmol/L (ref 0–100)

## 2013-04-10 MED ORDER — CLONIDINE HCL 0.1 MG PO TABS
0.2000 mg | ORAL_TABLET | Freq: Once | ORAL | Status: AC
  Administered 2013-04-10: 0.2 mg via ORAL

## 2013-04-10 MED ORDER — TERBINAFINE HCL 1 % EX CREA: TOPICAL_CREAM | Freq: Two times a day (BID) | CUTANEOUS | Status: DC

## 2013-04-10 MED ORDER — LISINOPRIL-HYDROCHLOROTHIAZIDE 10-12.5 MG PO TABS: 1.0000 | ORAL_TABLET | Freq: Every day | ORAL | Status: DC

## 2013-04-10 MED ORDER — CLONIDINE HCL 0.1 MG PO TABS
ORAL_TABLET | ORAL | Status: AC
  Filled 2013-04-10: qty 2

## 2013-04-10 NOTE — ED Provider Notes (Signed)
History     CSN: [id number]  Arrival date & time 04/10/13  1848   None     Chief Complaint  Patient presents with  . Groin Swelling    (Consider location/radiation/quality/duration/timing/severity/associated sxs/prior treatment) HPI Comments: Pt presents for evaluation of penile pruritis for about a month, starting to experience swelling today only.  He has never experienced anything like this before.  He denies any other symptoms including penile discharge, testicular pain, abdominal pain, pain/tenderness in penis.  Not sexually active.  Has a history of oral herpes, never any genital lesions.    Admits to previous Dx of hypertension.  He was started on zestoretic 10/12.5 and atenolol 12.5 about a year ago but stopped them.  Says laziness is the only reason he has not continued to take them.     Past Medical History  Diagnosis Date  . Hypertension   . Heart murmur     Past Surgical History  Procedure Laterality Date  . Laparoscopic appendectomy  03/30/2012    Procedure: APPENDECTOMY LAPAROSCOPIC;  Surgeon: Cherylynn Ridges, MD;  Location: [facility] OR;  Service: General;  Laterality: N/A;  . Appendectomy  03/30/2012    Family History  Problem Relation Age of Onset  . Cancer Father   . Heart disease Mother     History  Substance Use Topics  . Smoking status: Former Games developer  . Smokeless tobacco: Former Neurosurgeon    Quit date: 04/16/1992  . Alcohol Use: No      Review of Systems  Constitutional: Negative for fever and chills.  Respiratory: Negative for cough, chest tightness and shortness of breath.   Cardiovascular: Negative for chest pain, palpitations and leg swelling.  Gastrointestinal: Negative for abdominal pain, constipation, abdominal distention and rectal pain.  Genitourinary: Positive for penile swelling. Negative for dysuria, urgency, frequency, discharge, scrotal swelling, difficulty urinating, genital sores, penile pain and testicular pain.  Skin: Negative for rash.     Allergies  Review of patient's allergies indicates no known allergies.  Home Medications   Current Outpatient Rx  Name  Route  Sig  Dispense  Refill  . atenolol (TENORMIN) 12.5 mg TABS   Oral   Take 0.5 tablets (12.5 mg total) by mouth 2 (two) times daily.   30 tablet   1   . lisinopril-hydrochlorothiazide (PRINZIDE,ZESTORETIC) 10-12.5 MG per tablet   Oral   Take 1 tablet by mouth daily.   30 tablet   0     Must have office visit for further refills   . terbinafine (LAMISIL AT) 1 % cream   Topical   Apply topically 2 (two) times daily.   30 g   0     BP 200/122  Pulse 93  Temp(Src) 98.4 F (36.9 C) (Oral)  Resp 18  SpO2 99%  Physical Exam  Nursing note and vitals reviewed. Constitutional: He is oriented to person, place, and time. Vital signs are normal. He appears well-developed and well-nourished. No distress.  HENT:  Head: Atraumatic.  Eyes: Pupils are equal, round, and reactive to light. Right eye exhibits no discharge. Left eye exhibits no discharge.  Cardiovascular: Normal rate and regular rhythm.  Exam reveals no gallop and no friction rub.   Murmur (early systolic) heard.  Decrescendo systolic murmur is present with a grade of 3/6  Pulmonary/Chest: Effort normal and breath sounds normal. No respiratory distress. He has no wheezes. He has no rales. He exhibits no tenderness.  Genitourinary: Circumcised.  Swelling about the superior foreskin area.  There is a slight scale in the area that is swollen.  Foreskin is adherent to base the glans as well.    Neurological: He is alert and oriented to person, place, and time. He has normal strength.  Skin: Skin is warm and dry. He is not diaphoretic.  Psychiatric: He has a normal mood and affect. His behavior is normal. Judgment normal.    ED Course  Procedures (including critical care time)  Labs Reviewed  POCT I-STAT, CHEM 8 - Abnormal; Notable for the following:    Glucose, Bld 134 (*)    All other  components within normal limits   No results found.   1. Hypertension   2. Penile swelling       MDM  Discussed with Dr. Lorenz Coaster.  Will treat pt with lamisil cream for fungal infection, BID for 2 weeks and he will f/u with urologist if not better or at least significantly improving.    Pt given 0.2 mg clonidine in office today for his BP.  Will check an iStat for baseline and start him back on zestoretic 10/12.5 - creatinine is normal, will start the zestoretic.  Gave pt numbers for PCP to call to make an appointment.  Instructed to make a PCP appt ASAP for management of BP and for workup of undiagnosed heart murmur.    BP decreased down to 166/104 on recheck.  Pt feels fine, he will start taking the zestoretic in the AM.          Graylon Good, PA-C 04/10/13 2017

## 2013-04-10 NOTE — ED Provider Notes (Signed)
Medical screening examination/treatment/procedure(s) were performed by non-physician practitioner and as supervising physician I was immediately available for consultation/collaboration.  Leslee Home, M.D.  Reuben Likes, MD 04/10/13 2112

## 2013-04-10 NOTE — ED Notes (Signed)
C/O penile pruritis x 1 month; noticed swelling today.  Denies any discharge or pain.  Has hx HTN - used to be on diuretics, but ran out and does not have PCP.  Has been having posterior HAs recently, but denies any pain at present.  Denies any vision changes.  Provider notified of BP.

## 2013-04-10 NOTE — ED Notes (Signed)
Awaiting med from pharmacy

## 2013-04-10 NOTE — ED Notes (Signed)
Explained need to wait approx 30-40 following med.

## 2014-09-18 ENCOUNTER — Telehealth: Payer: Self-pay

## 2014-09-18 NOTE — Telephone Encounter (Signed)
A user error has taken place: encounter opened in error, closed for administrative reasons.

## 2015-04-03 ENCOUNTER — Emergency Department (HOSPITAL_COMMUNITY): Payer: 59

## 2015-04-03 ENCOUNTER — Observation Stay (HOSPITAL_COMMUNITY)
Admission: EM | Admit: 2015-04-03 | Discharge: 2015-04-05 | Disposition: A | Discharge status: Home / Self Care | Payer: 59 | Attending: Internal Medicine | Admitting: Internal Medicine

## 2015-04-03 ENCOUNTER — Encounter (HOSPITAL_COMMUNITY): Payer: Self-pay | Admitting: Adult Health

## 2015-04-03 DIAGNOSIS — I10 Essential (primary) hypertension: Secondary | ICD-10-CM | POA: Diagnosis not present

## 2015-04-03 DIAGNOSIS — R42 Dizziness and giddiness: Secondary | ICD-10-CM | POA: Insufficient documentation

## 2015-04-03 DIAGNOSIS — Z79899 Other long term (current) drug therapy: Secondary | ICD-10-CM | POA: Insufficient documentation

## 2015-04-03 DIAGNOSIS — R200 Anesthesia of skin: Secondary | ICD-10-CM | POA: Insufficient documentation

## 2015-04-03 DIAGNOSIS — E119 Type 2 diabetes mellitus without complications: Secondary | ICD-10-CM

## 2015-04-03 DIAGNOSIS — Z6832 Body mass index (BMI) 32.0-32.9, adult: Secondary | ICD-10-CM | POA: Insufficient documentation

## 2015-04-03 DIAGNOSIS — G458 Other transient cerebral ischemic attacks and related syndromes: Secondary | ICD-10-CM

## 2015-04-03 DIAGNOSIS — Z87891 Personal history of nicotine dependence: Secondary | ICD-10-CM | POA: Diagnosis not present

## 2015-04-03 DIAGNOSIS — G459 Transient cerebral ischemic attack, unspecified: Secondary | ICD-10-CM | POA: Diagnosis not present

## 2015-04-03 DIAGNOSIS — Z8673 Personal history of transient ischemic attack (TIA), and cerebral infarction without residual deficits: Secondary | ICD-10-CM | POA: Diagnosis present

## 2015-04-03 DIAGNOSIS — I639 Cerebral infarction, unspecified: Secondary | ICD-10-CM | POA: Diagnosis not present

## 2015-04-03 DIAGNOSIS — E669 Obesity, unspecified: Secondary | ICD-10-CM | POA: Insufficient documentation

## 2015-04-03 DIAGNOSIS — E785 Hyperlipidemia, unspecified: Secondary | ICD-10-CM | POA: Diagnosis not present

## 2015-04-03 DIAGNOSIS — R202 Paresthesia of skin: Secondary | ICD-10-CM | POA: Diagnosis present

## 2015-04-03 LAB — COMPREHENSIVE METABOLIC PANEL
ALBUMIN: 3.8 g/dL (ref 3.5–5.0)
ALT: 22 U/L (ref 17–63)
AST: 26 U/L (ref 15–41)
Alkaline Phosphatase: 129 U/L — ABNORMAL HIGH (ref 38–126)
Anion gap: 10 (ref 5–15)
BUN: 9 mg/dL (ref 6–20)
CALCIUM: 9.1 mg/dL (ref 8.9–10.3)
CO2: 26 mmol/L (ref 22–32)
Chloride: 100 mmol/L — ABNORMAL LOW (ref 101–111)
Creatinine, Ser: 1.19 mg/dL (ref 0.61–1.24)
GFR calc Af Amer: >60 mL/min (ref >60)
Glucose, Bld: 361 mg/dL — ABNORMAL HIGH (ref 65–99)
POTASSIUM: 3.9 mmol/L (ref 3.5–5.1)
SODIUM: 136 mmol/L (ref 135–145)
TOTAL PROTEIN: 7.2 g/dL (ref 6.5–8.1)
Total Bilirubin: 0.9 mg/dL (ref 0.3–1.2)

## 2015-04-03 LAB — I-STAT CHEM 8, ED
BUN: 10 mg/dL (ref 6–20)
CHLORIDE: 99 mmol/L — AB (ref 101–111)
Calcium, Ion: 1.12 mmol/L (ref 1.12–1.23)
Creatinine, Ser: 1.2 mg/dL (ref 0.61–1.24)
GLUCOSE: 358 mg/dL — AB (ref 65–99)
HCT: 55 % — ABNORMAL HIGH (ref 39.0–52.0)
Hemoglobin: 18.7 g/dL — ABNORMAL HIGH (ref 13.0–17.0)
Potassium: 3.8 mmol/L (ref 3.5–5.1)
SODIUM: 139 mmol/L (ref 135–145)
TCO2: 25 mmol/L (ref 0–100)

## 2015-04-03 LAB — DIFFERENTIAL
Basophils Absolute: 0 10*3/uL (ref 0.0–0.1)
Basophils Relative: 1 % (ref 0–1)
Eosinophils Absolute: 0.1 10*3/uL (ref 0.0–0.7)
Eosinophils Relative: 1 % (ref 0–5)
Lymphocytes Relative: 45 % (ref 12–46)
Lymphs Abs: 3.4 10*3/uL (ref 0.7–4.0)
MONO ABS: 0.4 10*3/uL (ref 0.1–1.0)
Monocytes Relative: 5 % (ref 3–12)
NEUTROS ABS: 3.6 10*3/uL (ref 1.7–7.7)
Neutrophils Relative %: 48 % (ref 43–77)

## 2015-04-03 LAB — CBC
HEMATOCRIT: 49.5 % (ref 39.0–52.0)
HEMOGLOBIN: 17 g/dL (ref 13.0–17.0)
MCH: 30.8 pg (ref 26.0–34.0)
MCHC: 34.3 g/dL (ref 30.0–36.0)
MCV: 89.7 fL (ref 78.0–100.0)
Platelets: 202 10*3/uL (ref 150–400)
RBC: 5.52 MIL/uL (ref 4.22–5.81)
RDW: 13 % (ref 11.5–15.5)
WBC: 7.5 10*3/uL (ref 4.0–10.5)

## 2015-04-03 LAB — PROTIME-INR
INR: 1.02 (ref 0.00–1.49)
PROTHROMBIN TIME: 13.5 s (ref 11.6–15.2)

## 2015-04-03 LAB — APTT: APTT: 29 s (ref 24–37)

## 2015-04-03 LAB — I-STAT TROPONIN, ED: TROPONIN I, POC: 0 ng/mL (ref 0.00–0.08)

## 2015-04-03 LAB — CBG MONITORING, ED: Glucose-Capillary: 317 mg/dL — ABNORMAL HIGH (ref 65–99)

## 2015-04-03 MED ORDER — LISINOPRIL 10 MG PO TABS
10.0000 mg | ORAL_TABLET | Freq: Every day | ORAL | Status: DC
  Administered 2015-04-03: 10 mg via ORAL
  Filled 2015-04-03: qty 1

## 2015-04-03 MED ORDER — INSULIN ASPART 100 UNIT/ML ~~LOC~~ SOLN
0.0000 [IU] | Freq: Three times a day (TID) | SUBCUTANEOUS | Status: DC
  Administered 2015-04-04: 8 [IU] via SUBCUTANEOUS
  Administered 2015-04-04 – 2015-04-05 (×4): 5 [IU] via SUBCUTANEOUS

## 2015-04-03 MED ORDER — INSULIN ASPART 100 UNIT/ML ~~LOC~~ SOLN
0.0000 [IU] | Freq: Every day | SUBCUTANEOUS | Status: DC
  Administered 2015-04-03: 5 [IU] via SUBCUTANEOUS
  Administered 2015-04-04: 2 [IU] via SUBCUTANEOUS

## 2015-04-03 MED ORDER — HEPARIN SODIUM (PORCINE) 5000 UNIT/ML IJ SOLN
5000.0000 [IU] | Freq: Three times a day (TID) | INTRAMUSCULAR | Status: DC
  Administered 2015-04-04 – 2015-04-05 (×3): 5000 [IU] via SUBCUTANEOUS
  Filled 2015-04-03 (×4): qty 1

## 2015-04-03 MED ORDER — ASPIRIN 325 MG PO TABS
325.0000 mg | ORAL_TABLET | Freq: Every day | ORAL | Status: DC
  Administered 2015-04-04 – 2015-04-05 (×2): 325 mg via ORAL
  Filled 2015-04-03 (×2): qty 1

## 2015-04-03 MED ORDER — ASPIRIN 300 MG RE SUPP: 300.0000 mg | Freq: Every day | RECTAL | Status: DC

## 2015-04-03 MED ORDER — STROKE: EARLY STAGES OF RECOVERY BOOK
Freq: Once | Status: AC
  Administered 2015-04-03: 23:00:00

## 2015-04-03 MED ORDER — LABETALOL HCL 5 MG/ML IV SOLN
20.0000 mg | Freq: Once | INTRAVENOUS | Status: AC
  Administered 2015-04-03: 20 mg via INTRAVENOUS
  Filled 2015-04-03: qty 4

## 2015-04-03 NOTE — Consult Note (Signed)
Stroke Consult    Chief Complaint: left sided sensory loss HPI: Michael Stephenson is an 59 y.o. male hx of HTN presenting for evaluation of left sided sensory loss. Symptoms started while eating dinner, lasted less than 10 minutes and have now completely resolved. Denies any associated weakness, visual or speech deficits. No difficulty walking. No prior TIA or stroke. Upon arrival to ED noted to have BP of 240/127.   CT head imaging reviewed and shows no acute process.   Date last known well: 04/03/2015 Time last known well: 1700 tPA Given: no, symptoms resolved  Past Medical History  Diagnosis Date  . Hypertension   . Heart murmur     Past Surgical History  Procedure Laterality Date  . Laparoscopic appendectomy  03/30/2012    Procedure: APPENDECTOMY LAPAROSCOPIC;  Surgeon: Gwenyth Ober, MD;  Location: [place];  Service: General;  Laterality: N/A;  . Appendectomy  03/30/2012    Family History  Problem Relation Age of Onset  . Cancer Father   . Heart disease Mother    Social History:  reports that he has quit smoking. He quit smokeless tobacco use about 22 years ago. He reports that he does not drink alcohol or use illicit drugs.  Allergies: No Known Allergies   (Not in a hospital admission)  ROS: Out of a complete 14 system review, the patient complains of only the following symptoms, and all other reviewed systems are negative. +numbness   Physical Examination: Filed Vitals:   04/03/15 1950  BP: 177/96  Pulse: 81  Temp:   Resp: 12   Physical Exam  Constitutional: He appears well-developed and well-nourished.  Psych: Affect appropriate to situation Eyes: No scleral injection HENT: No OP obstrucion Head: Normocephalic.  Cardiovascular: Normal rate and regular rhythm.  Respiratory: Effort normal and breath sounds normal.  GI: Soft. Bowel sounds are normal. No distension. There is no tenderness.  Skin: WDI   Neurologic Examination: Mental Status: Alert,  oriented, thought content appropriate.  Speech fluent without evidence of aphasia.  Able to follow 3 step commands without difficulty. Cranial Nerves: II: funduscopic exam wnl bilaterally, visual fields grossly normal, pupils equal, round, reactive to light and accommodation III,IV, VI: ptosis not present, extra-ocular motions intact bilaterally V,VII: smile symmetric, facial light touch sensation normal bilaterally VIII: hearing normal bilaterally IX,X: gag reflex present XI: trapezius strength/neck flexion strength normal bilaterally XII: tongue strength normal  Motor: Right : Upper extremity    Left:     Upper extremity 5/5 deltoid       5/5 deltoid 5/5 biceps      5/5 biceps  5/5 triceps      5/5 triceps 5/5 hand grip      5/5 hand grip  Lower extremity     Lower extremity 5/5 hip flexor      5/5 hip flexor 5/5 quadricep      5/5 quadriceps  5/5 hamstrings     5/5 hamstrings 5/5 plantar flexion       5/5 plantar flexion 5/5 plantar extension     5/5 plantar extension Tone and bulk:normal tone throughout; no atrophy noted Sensory: Pinprick and light touch intact throughout, bilaterally Deep Tendon Reflexes: 2+ and symmetric throughout Plantars: Right: downgoing   Left: downgoing Cerebellar: normal finger-to-nose,  and normal heel-to-shin test Gait: normal gait and station  Laboratory Studies:   Basic Metabolic Panel:  Recent Labs Lab 04/03/15 1922 04/03/15 1930  NA 136 139  K 3.9 3.8  CL 100* 99*  CO2 26  --   GLUCOSE 361* 358*  BUN 9 10  CREATININE 1.19 1.20  CALCIUM 9.1  --     Liver Function Tests:  Recent Labs Lab 04/03/15 1922  AST 26  ALT 22  ALKPHOS 129*  BILITOT 0.9  PROT 7.2  ALBUMIN 3.8   No results for input(s): LIPASE, AMYLASE in the last 168 hours. No results for input(s): AMMONIA in the last 168 hours.  CBC:  Recent Labs Lab 04/03/15 1922 04/03/15 1930  WBC 7.5  --   NEUTROABS 3.6  --   HGB 17.0 18.7*  HCT 49.5 55.0*  MCV 89.7   --   PLT 202  --     Cardiac Enzymes: No results for input(s): CKTOTAL, CKMB, CKMBINDEX, TROPONINI in the last 168 hours.  BNP: Invalid input(s): POCBNP  CBG:  Recent Labs Lab 04/03/15 1914  GLUCAP 317*    Microbiology: No results found for this or any previous visit.  Coagulation Studies:  Recent Labs  04/03/15 1922  LABPROT 13.5  INR 1.02    Urinalysis: No results for input(s): COLORURINE, LABSPEC, PHURINE, GLUCOSEU, HGBUR, BILIRUBINUR, KETONESUR, PROTEINUR, UROBILINOGEN, NITRITE, LEUKOCYTESUR in the last 168 hours.  Invalid input(s): APPERANCEUR  Lipid Panel:  No results found for: CHOL, TRIG, HDL, CHOLHDL, VLDL, LDLCALC  HgbA1C: No results found for: HGBA1C  Urine Drug Screen:  No results found for: LABOPIA, COCAINSCRNUR, LABBENZ, AMPHETMU, THCU, LABBARB  Alcohol Level: No results for input(s): ETH in the last 168 hours.  Other results:  Imaging: Ct Head (brain) Wo Contrast  04/03/2015   CLINICAL DATA:  Acute onset.  Left-sided numbness.  EXAM: CT HEAD WITHOUT CONTRAST  TECHNIQUE: Contiguous axial images were obtained from the base of the skull through the vertex without intravenous contrast.  COMPARISON:  None.  FINDINGS: No acute intracranial hemorrhage. No focal mass lesion. No CT evidence of acute infarction. No midline shift or mass effect. No hydrocephalus. Basilar cisterns are patent. Paranasal sinuses and mastoid air cells are clear.  IMPRESSION: Normal head CT.   Electronically Signed   By: Suzy Bouchard M.D.   On: 04/03/2015 20:32    Assessment: 59 y.o. male history of poorly controlled hypertension presenting with acute onset of transient left sided numbness. Currently asymptomatic. Upon arrival noted to have BP of 240/127. Cannot rule out TIA though symptoms also potentially related to elevated BP.   Stroke Risk Factors - HTN  Plan: 1. HgbA1c, fasting lipid panel 2. MRI, MRA  of the brain without contrast 3. PT consult, OT consult, Speech  consult 4. Echocardiogram 5. Carotid dopplers 6. Prophylactic therapy-ASA 325mg  daily 7. Risk factor modification 8. Telemetry monitoring 9. Frequent neuro checks 10. NPO until RN stroke swallow screen    Jim Like, DO [facility] [phone]  If 7pm- 7am, please page neurology on call as listed in [place]. 04/03/2015, 8:59 PM

## 2015-04-03 NOTE — ED Notes (Signed)
Patient transported to CT 

## 2015-04-03 NOTE — ED Notes (Signed)
While at [place] began having left sided numbness at 5:30 Pm, from left lip down to foot-symptoms resolved and denies numbness at this time-no facial droop or arm drift-no slurred speech-he took aleve about 20 minutes ago-bilateral grips equal. BP 240/127, CBG 357, denies pain at this time.

## 2015-04-03 NOTE — ED Provider Notes (Signed)
CSN: [id number]     Arrival date & time 04/03/15  1906 History   First MD Initiated Contact with Patient 04/03/15 1927     Chief Complaint  Patient presents with  . Stroke Symptoms     (Consider location/radiation/quality/duration/timing/severity/associated sxs/prior Treatment) The history is provided by the patient.  Patient presents c/o onset left side of body, arm and leg, numbness/tingling sensation onset a couple hours ago. States was at dinner at time. Was still able to ambulate and function normally. No weakness. No problems w gait or balance. No change in speech or vision. Then went to store, symptoms persisted so came to ED.  By time of arrival to ED, symptoms have completely resolved. Denies similar symptoms previously.  Hx htn, not on meds for several months. Denies headache. No neck or back pain. No fever or chills. +recent polyuria, polydipsia, no hx diabetes previously.      Past Medical History  Diagnosis Date  . Hypertension   . Heart murmur    Past Surgical History  Procedure Laterality Date  . Laparoscopic appendectomy  03/30/2012    Procedure: APPENDECTOMY LAPAROSCOPIC;  Surgeon: Gwenyth Ober, MD;  Location: [facility];  Service: General;  Laterality: N/A;  . Appendectomy  03/30/2012   Family History  Problem Relation Age of Onset  . Cancer Father   . Heart disease Mother    History  Substance Use Topics  . Smoking status: Former Research scientist (life sciences)  . Smokeless tobacco: Former Systems developer    Quit date: 04/16/1992  . Alcohol Use: No    Review of Systems  Constitutional: Negative for fever and chills.  HENT: Negative for sore throat and trouble swallowing.   Eyes: Negative for visual disturbance.  Respiratory: Negative for shortness of breath.   Cardiovascular: Negative for chest pain.  Gastrointestinal: Negative for vomiting and abdominal pain.  Endocrine: Positive for polyuria.  Genitourinary: Negative for dysuria and flank pain.  Musculoskeletal: Negative for back pain and  neck pain.  Skin: Negative for rash.  Neurological: Positive for numbness. Negative for speech difficulty, weakness and headaches.  Hematological: Does not bruise/bleed easily.  Psychiatric/Behavioral: Negative for confusion.      Allergies  Review of patient's allergies indicates no known allergies.  Home Medications   Prior to Admission medications   Medication Sig Start Date End Date Taking? Authorizing Provider  atenolol (TENORMIN) 12.5 mg TABS Take 0.5 tablets (12.5 mg total) by mouth 2 (two) times daily. 03/31/12   Erroll Luna, MD  lisinopril-hydrochlorothiazide (PRINZIDE,ZESTORETIC) 10-12.5 MG per tablet Take 1 tablet by mouth daily. 04/10/13   Freeman Caldron Baker, PA-C  terbinafine (LAMISIL AT) 1 % cream Apply topically 2 (two) times daily. 04/10/13   Freeman Caldron Baker, PA-C   BP 240/127 mmHg  Pulse 102  Temp(Src) 98.1 F (36.7 C) (Oral)  Resp 16  Wt 192 lb 1 oz (87.119 kg)  SpO2 98% Physical Exam  Constitutional: He is oriented to person, place, and time. He appears well-developed and well-nourished. No distress.  HENT:  Head: Atraumatic.  Mouth/Throat: Oropharynx is clear and moist.  Eyes: Conjunctivae and EOM are normal. Pupils are equal, round, and reactive to light.  Neck: Neck supple. No tracheal deviation present. No thyromegaly present.  No bruit  Cardiovascular: Normal rate, regular rhythm, normal heart sounds and intact distal pulses.  Exam reveals no gallop and no friction rub.   No murmur heard. Pulmonary/Chest: Effort normal and breath sounds normal. No accessory muscle usage. No respiratory distress.  Abdominal: Soft. Bowel  sounds are normal. He exhibits no distension. There is no tenderness.  Musculoskeletal: Normal range of motion. He exhibits no edema or tenderness.  Neurological: He is alert and oriented to person, place, and time. No cranial nerve deficit.  No pronator drift. Finger to nose normal bil. Motor intact bil, stre 5/5, sens grossly intact.    Skin: Skin is warm and dry. He is not diaphoretic.  Psychiatric: He has a normal mood and affect.  Nursing note and vitals reviewed.   ED Course  Procedures (including critical care time) Labs Review  Results for orders placed or performed during the hospital encounter of 04/03/15  Protime-INR  Result Value Ref Range   Prothrombin Time 13.5 11.6 - 15.2 seconds   INR 1.02 0.00 - 1.49  APTT  Result Value Ref Range   aPTT 29 24 - 37 seconds  CBC  Result Value Ref Range   WBC 7.5 4.0 - 10.5 K/uL   RBC 5.52 4.22 - 5.81 MIL/uL   Hemoglobin 17.0 13.0 - 17.0 g/dL   HCT 49.5 39.0 - 52.0 %   MCV 89.7 78.0 - 100.0 fL   MCH 30.8 26.0 - 34.0 pg   MCHC 34.3 30.0 - 36.0 g/dL   RDW 13.0 11.5 - 15.5 %   Platelets 202 150 - 400 K/uL  Differential  Result Value Ref Range   Neutrophils Relative % 48 43 - 77 %   Neutro Abs 3.6 1.7 - 7.7 K/uL   Lymphocytes Relative 45 12 - 46 %   Lymphs Abs 3.4 0.7 - 4.0 K/uL   Monocytes Relative 5 3 - 12 %   Monocytes Absolute 0.4 0.1 - 1.0 K/uL   Eosinophils Relative 1 0 - 5 %   Eosinophils Absolute 0.1 0.0 - 0.7 K/uL   Basophils Relative 1 0 - 1 %   Basophils Absolute 0.0 0.0 - 0.1 K/uL  Comprehensive metabolic panel  Result Value Ref Range   Sodium 136 135 - 145 mmol/L   Potassium 3.9 3.5 - 5.1 mmol/L   Chloride 100 (L) 101 - 111 mmol/L   CO2 26 22 - 32 mmol/L   Glucose, Bld 361 (H) 65 - 99 mg/dL   BUN 9 6 - 20 mg/dL   Creatinine, Ser 1.19 0.61 - 1.24 mg/dL   Calcium 9.1 8.9 - 10.3 mg/dL   Total Protein 7.2 6.5 - 8.1 g/dL   Albumin 3.8 3.5 - 5.0 g/dL   AST 26 15 - 41 U/L   ALT 22 17 - 63 U/L   Alkaline Phosphatase 129 (H) 38 - 126 U/L   Total Bilirubin 0.9 0.3 - 1.2 mg/dL   GFR calc non Af Amer >60 >60 mL/min   GFR calc Af Amer >60 >60 mL/min   Anion gap 10 5 - 15  CBG monitoring, ED  Result Value Ref Range   Glucose-Capillary 317 (H) 65 - 99 mg/dL  I-Stat Chem 8, ED  (not at [facility], [facility])  Result Value Ref Range   Sodium 139 135 - 145  mmol/L   Potassium 3.8 3.5 - 5.1 mmol/L   Chloride 99 (L) 101 - 111 mmol/L   BUN 10 6 - 20 mg/dL   Creatinine, Ser 1.20 0.61 - 1.24 mg/dL   Glucose, Bld 358 (H) 65 - 99 mg/dL   Calcium, Ion 1.12 1.12 - 1.23 mmol/L   TCO2 25 0 - 100 mmol/L   Hemoglobin 18.7 (H) 13.0 - 17.0 g/dL   HCT 55.0 (H) 39.0 - 52.0 %  I-stat troponin, ED (not at [facility])  Result Value Ref Range   Troponin i, poc 0.00 0.00 - 0.08 ng/mL   Comment 3           Ct Head (brain) Wo Contrast  04/03/2015   CLINICAL DATA:  Acute onset.  Left-sided numbness.  EXAM: CT HEAD WITHOUT CONTRAST  TECHNIQUE: Contiguous axial images were obtained from the base of the skull through the vertex without intravenous contrast.  COMPARISON:  None.  FINDINGS: No acute intracranial hemorrhage. No focal mass lesion. No CT evidence of acute infarction. No midline shift or mass effect. No hydrocephalus. Basilar cisterns are patent. Paranasal sinuses and mastoid air cells are clear.  IMPRESSION: Normal head CT.   Electronically Signed   By: Suzy Bouchard M.D.   On: 04/03/2015 20:32       EKG Interpretation   Date/Time:  Saturday Apr 03 2015 19:42:37 EDT Ventricular Rate:  92 PR Interval:  161 QRS Duration: 89 QT Interval:  353 QTC Calculation: 437 R Axis:   -84 Text Interpretation:  Sinus rhythm Left anterior fascicular block No  previous tracing Confirmed by Ashok Cordia  MD, Lennette Bihari ([id number]) on 04/03/2015  8:35:54 PM      MDM   Iv ns. Labs. Ct.  Reviewed nursing notes and prior charts for additional history.   Recheck bp still very high.  pts earlier numbness has completely resolved.   bp remains high. Labetalol 20 mg iv.   Neurology consulted.  bs high. Iv ns bolus.   Discussed with neurology - he indicates admit to med service for completion tia workup and management bp and bs.   Hospitalists paged for admission.  Recheck bp improved. No recurrent numbness. No weakness. No headache.      Lajean Saver, MD 04/03/15  [phone]

## 2015-04-03 NOTE — Progress Notes (Signed)
Pt admitted from the ED with stroke like symptoms,denies any pain at this time,pt settled in bed,v/s stable and call light within pt's reach,will continue to monitor. Michael, [name]

## 2015-04-03 NOTE — H&P (Signed)
[facility] History and Physical  Patient: Michael Stephenson  MRN: [id number]  DOB: [birth year]  DOS: the patient was seen and examined on 04/03/2015 PCP: No primary care provider on file.  Referring physician: Dr. Ashok Cordia Chief Complaint: Dizziness and left-sided numbness  HPI: Michael Stephenson is a 59 y.o. male with Past medical history of essential hypertension. The patient is presenting with numbness of numbness of left arm and leg. This started at around 5 PM. Patient was eating dinner and while he was walking he started having difficulty with walking as well as sensation. He denies any headache or dizziness. Denies any speech slurring. Denies any chest pain chest 7**no shortness of breath cough fever chills nausea vomiting diarrhea or burning urination. He does not smoke does not do alcohol or no drugs. He also mentions that since last few months he has been having increasing frequency of urination as well as increased thirst. He also has lost 20 pounds. He denies any medication use at present. Does not see any physician on regular basis.  The patient is coming from home. And at his baseline independent for most of his ADL.  Review of Systems: as mentioned in the history of present illness.  A comprehensive review of the other systems is negative.  Past Medical History  Diagnosis Date  . Hypertension   . Heart murmur    Past Surgical History  Procedure Laterality Date  . Laparoscopic appendectomy  03/30/2012    Procedure: APPENDECTOMY LAPAROSCOPIC;  Surgeon: Gwenyth Ober, MD;  Location: [place];  Service: General;  Laterality: N/A;  . Appendectomy  03/30/2012   Social History:  reports that he has quit smoking. He quit smokeless tobacco use about 22 years ago. He reports that he does not drink alcohol or use illicit drugs.  No Known Allergies  Family History  Problem Relation Age of Onset  . Cancer Father   . Heart disease Mother     Prior to Admission medications     Not on File    Physical Exam: Filed Vitals:   04/03/15 2114 04/03/15 2120 04/03/15 2132 04/03/15 2152  BP:  178/99 151/70 159/90  Pulse:  75 75 75  Temp: 98.4 F (36.9 C)  98.3 F (36.8 C) 98.8 F (37.1 C)  TempSrc:   Oral Oral  Resp:  16 16 16   Height:    5\' 4"  (1.626 m)  Weight:    86.3 kg (190 lb 4.1 oz)  SpO2:  97% 96% 96%    General: Alert, Awake and Oriented to Time, Place and Person. Appear in mild distress Eyes: PERRL ENT: Oral Mucosa clear moist. Neck: no JVD Cardiovascular: S1 and S2 Present, no Murmur, Peripheral Pulses Present Respiratory: Bilateral Air entry equal and Decreased,  Clear to Auscultation, no Crackles, no wheezes Abdomen: Bowel Sound present, Soft and nono tender Skin: no Rash Extremities: no Pedal edema, no calf tenderness Neurologic: Grossly no focal neuro deficit.  Labs on Admission:  CBC:  Recent Labs Lab 04/03/15 1922 04/03/15 1930  WBC 7.5  --   NEUTROABS 3.6  --   HGB 17.0 18.7*  HCT 49.5 55.0*  MCV 89.7  --   PLT 202  --     CMP     Component Value Date/Time   NA 139 04/03/2015 1930   K 3.8 04/03/2015 1930   CL 99* 04/03/2015 1930   CO2 26 04/03/2015 1922   GLUCOSE 358* 04/03/2015 1930   BUN 10 04/03/2015 1930   CREATININE 1.20  04/03/2015 1930   CREATININE 1.27 03/30/2012 0927   CALCIUM 9.1 04/03/2015 1922   PROT 7.2 04/03/2015 1922   ALBUMIN 3.8 04/03/2015 1922   AST 26 04/03/2015 1922   ALT 22 04/03/2015 1922   ALKPHOS 129* 04/03/2015 1922   BILITOT 0.9 04/03/2015 1922   GFRNONAA >60 04/03/2015 1922   GFRAA >60 04/03/2015 1922    No results for input(s): LIPASE, AMYLASE in the last 168 hours.  No results for input(s): CKTOTAL, CKMB, CKMBINDEX, TROPONINI in the last 168 hours. BNP (last 3 results) No results for input(s): BNP in the last 8760 hours.  ProBNP (last 3 results) No results for input(s): PROBNP in the last 8760 hours.   Radiological Exams on Admission: Ct Head (brain) Wo  Contrast  04/03/2015   CLINICAL DATA:  Acute onset.  Left-sided numbness.  EXAM: CT HEAD WITHOUT CONTRAST  TECHNIQUE: Contiguous axial images were obtained from the base of the skull through the vertex without intravenous contrast.  COMPARISON:  None.  FINDINGS: No acute intracranial hemorrhage. No focal mass lesion. No CT evidence of acute infarction. No midline shift or mass effect. No hydrocephalus. Basilar cisterns are patent. Paranasal sinuses and mastoid air cells are clear.  IMPRESSION: Normal head CT.   Electronically Signed   By: Suzy Bouchard M.D.   On: 04/03/2015 20:32   EKG: Independently reviewed. normal sinus rhythm, nonspecific ST and T waves changes.  Assessment/Plan Principal Problem:   TIA (transient ischemic attack) Active Problems:   Hypertension   Diabetes mellitus, new onset   1. TIA (transient ischemic attack) The patient is presenting with numbness of left-sided numbness which is resolved at present. CT of the head is unremarkable. Neurology has evaluated the patient. Recommend further stroke workup. We will continue with aspirin as per neurology. PTOT speech consultation. Check hemoglobin A1c and lipid profile as well as MRI brain and echocardiogram and carotid Doppler.  2. Essential hypertension. Next and patient does not take any medication. Blood pressure mildly improved. Permissive hypertension at present. Start Low-dose lisinopril.  3. New onset diabetes mellitus. Most likely type II. Check hemoglobin A1c. Placing him on sliding scale. Long-term management to be decided depending on the hemoglobin A1c. Patient requested to find a PCP to continue management of the diabetes an outpatient.   Advance goals of care discussion: Full code   Consults: ED physician discussed with Dr Janann Colonel from neurology.  DVT Prophylaxis: subcutaneous Heparin Nutrition: Nothing by mouth pending stroke evaluation  Disposition: Admitted as observation, telemetry  unit.  Author: Berle Mull, MD [facility] Pager: [phone]   If 7PM-7AM, please contact night-coverage [email or web address] Password [email or web address]

## 2015-04-04 ENCOUNTER — Encounter (HOSPITAL_COMMUNITY): Payer: Self-pay | Admitting: *Deleted

## 2015-04-04 ENCOUNTER — Observation Stay (HOSPITAL_COMMUNITY): Payer: 59

## 2015-04-04 DIAGNOSIS — I10 Essential (primary) hypertension: Secondary | ICD-10-CM

## 2015-04-04 DIAGNOSIS — R202 Paresthesia of skin: Secondary | ICD-10-CM

## 2015-04-04 DIAGNOSIS — G458 Other transient cerebral ischemic attacks and related syndromes: Secondary | ICD-10-CM

## 2015-04-04 DIAGNOSIS — G459 Transient cerebral ischemic attack, unspecified: Secondary | ICD-10-CM

## 2015-04-04 DIAGNOSIS — E119 Type 2 diabetes mellitus without complications: Secondary | ICD-10-CM

## 2015-04-04 DIAGNOSIS — R42 Dizziness and giddiness: Secondary | ICD-10-CM

## 2015-04-04 LAB — CBC WITH DIFFERENTIAL/PLATELET
BASOS ABS: 0 10*3/uL (ref 0.0–0.1)
Basophils Relative: 1 % (ref 0–1)
Eosinophils Absolute: 0.1 10*3/uL (ref 0.0–0.7)
Eosinophils Relative: 1 % (ref 0–5)
HEMATOCRIT: 44.2 % (ref 39.0–52.0)
HEMOGLOBIN: 15.1 g/dL (ref 13.0–17.0)
Lymphocytes Relative: 46 % (ref 12–46)
Lymphs Abs: 3.3 10*3/uL (ref 0.7–4.0)
MCH: 31 pg (ref 26.0–34.0)
MCHC: 34.2 g/dL (ref 30.0–36.0)
MCV: 90.8 fL (ref 78.0–100.0)
Monocytes Absolute: 0.4 10*3/uL (ref 0.1–1.0)
Monocytes Relative: 6 % (ref 3–12)
NEUTROS PCT: 46 % (ref 43–77)
Neutro Abs: 3.4 10*3/uL (ref 1.7–7.7)
Platelets: 203 10*3/uL (ref 150–400)
RBC: 4.87 MIL/uL (ref 4.22–5.81)
RDW: 13.1 % (ref 11.5–15.5)
WBC: 7.2 10*3/uL (ref 4.0–10.5)

## 2015-04-04 LAB — GLUCOSE, CAPILLARY
GLUCOSE-CAPILLARY: 213 mg/dL — AB (ref 65–99)
Glucose-Capillary: 326 mg/dL — ABNORMAL HIGH (ref 65–99)
Glucose-Capillary: 249 mg/dL — ABNORMAL HIGH (ref 65–99)
Glucose-Capillary: 275 mg/dL — ABNORMAL HIGH (ref 65–99)
Glucose-Capillary: 221 mg/dL — ABNORMAL HIGH (ref 65–99)

## 2015-04-04 LAB — COMPREHENSIVE METABOLIC PANEL
ALBUMIN: 2.9 g/dL — AB (ref 3.5–5.0)
ALT: 16 U/L — ABNORMAL LOW (ref 17–63)
AST: 25 U/L (ref 15–41)
Alkaline Phosphatase: 103 U/L (ref 38–126)
Anion gap: 11 (ref 5–15)
BILIRUBIN TOTAL: 1.1 mg/dL (ref 0.3–1.2)
BUN: 13 mg/dL (ref 6–20)
CO2: 25 mmol/L (ref 22–32)
Calcium: 8.4 mg/dL — ABNORMAL LOW (ref 8.9–10.3)
Chloride: 103 mmol/L (ref 101–111)
Creatinine, Ser: 0.99 mg/dL (ref 0.61–1.24)
GFR calc Af Amer: >60 mL/min (ref >60)
GFR calc non Af Amer: >60 mL/min (ref >60)
Glucose, Bld: 267 mg/dL — ABNORMAL HIGH (ref 65–99)
POTASSIUM: 3.4 mmol/L — AB (ref 3.5–5.1)
Sodium: 139 mmol/L (ref 135–145)
TOTAL PROTEIN: 5.8 g/dL — AB (ref 6.5–8.1)

## 2015-04-04 LAB — RAPID URINE DRUG SCREEN, HOSP PERFORMED
AMPHETAMINES: NOT DETECTED
BARBITURATES: NOT DETECTED
BENZODIAZEPINES: NOT DETECTED
Cocaine: NOT DETECTED
Opiates: NOT DETECTED
Tetrahydrocannabinol: NOT DETECTED

## 2015-04-04 LAB — LIPID PANEL
Cholesterol: 210 mg/dL — ABNORMAL HIGH (ref 0–200)
HDL: 31 mg/dL — AB (ref >40)
LDL Cholesterol: 111 mg/dL — ABNORMAL HIGH (ref 0–99)
TRIGLYCERIDES: 339 mg/dL — AB (ref <150)
Total CHOL/HDL Ratio: 6.8 RATIO
VLDL: 68 mg/dL — ABNORMAL HIGH (ref 0–40)

## 2015-04-04 MED ORDER — LISINOPRIL 10 MG PO TABS
10.0000 mg | ORAL_TABLET | Freq: Every day | ORAL | Status: DC
  Filled 2015-04-04: qty 1

## 2015-04-04 MED ORDER — SIMVASTATIN 20 MG PO TABS
20.0000 mg | ORAL_TABLET | Freq: Every day | ORAL | Status: DC
  Administered 2015-04-04 – 2015-04-05 (×2): 20 mg via ORAL
  Filled 2015-04-04 (×2): qty 1

## 2015-04-04 MED ORDER — GLIPIZIDE 5 MG PO TABS
2.5000 mg | ORAL_TABLET | Freq: Two times a day (BID) | ORAL | Status: DC
  Administered 2015-04-04: 18:00:00 via ORAL
  Administered 2015-04-05 (×2): 2.5 mg via ORAL
  Filled 2015-04-04 (×3): qty 1

## 2015-04-04 MED ORDER — SODIUM CHLORIDE 0.9 % IV SOLN
INTRAVENOUS | Status: DC
  Administered 2015-04-04 – 2015-04-05 (×3): via INTRAVENOUS

## 2015-04-04 MED ORDER — POTASSIUM CHLORIDE CRYS ER 20 MEQ PO TBCR
40.0000 meq | EXTENDED_RELEASE_TABLET | Freq: Once | ORAL | Status: AC
  Administered 2015-04-04: 40 meq via ORAL
  Filled 2015-04-04: qty 2

## 2015-04-04 NOTE — Progress Notes (Signed)
VASCULAR LAB PRELIMINARY  PRELIMINARY  PRELIMINARY  PRELIMINARY  Carotid Dopplers completed.    Preliminary report:  1-39% ICA stenosis.  Vertebral artery flow is antegrade.  Dekota Kirlin, RVT 04/04/2015, 12:02 PM

## 2015-04-04 NOTE — Progress Notes (Signed)
[facility] PROGRESS NOTE  Michael Stephenson WUJ:[id number] DOB: Oct 07, 1956 DOA: 04/03/2015 PCP: No primary care provider on file.  Assessment/Plan: 1-Acute Stroke;  Continue with aspirin.  BP and diabetes controlled.  Carotid Doppler 1-39% ICA stenosis.  ECHO pending/ \ 2-Diabetes;  Awaiting HbA1 c to determine if he will need insulin.  Will start glipizide.   3-HLD;  Start simvastatin.   Hypertension Continue with lisinopril.  resume atenolol if BP increases.   Code Status: full code.  Family Communication: care discussed with patient.  Disposition Plan: home in 24 hours.    Consultants:  neurology  Procedures:  ECHO;  Doppler;   Antibiotics:  none  HPI/Subjective: Feeling better, left side weakness resolved.   Objective: Filed Vitals:   04/04/15 1352  BP: 116/67  Pulse: 64  Temp: 98.6 F (37 C)  Resp: 18   No intake or output data in the 24 hours ending 04/04/15 1442 Filed Weights   04/03/15 1917 04/03/15 2152  Weight: 87.119 kg (192 lb 1 oz) 86.3 kg (190 lb 4.1 oz)    Exam:   General:  Alert in NAD  Cardiovascular: S 1, S 2 RRR  Respiratory: CTA  Abdomen: BS present, nt, nd  Musculoskeletal: no edema   Neuro; non focal.   Data Reviewed: Basic Metabolic Panel:  Recent Labs Lab 04/03/15 1922 04/03/15 1930 04/04/15 0632  NA 136 139 139  K 3.9 3.8 3.4*  CL 100* 99* 103  CO2 26  --  25  GLUCOSE 361* 358* 267*  BUN 9 10 13   CREATININE 1.19 1.20 0.99  CALCIUM 9.1  --  8.4*   Liver Function Tests:  Recent Labs Lab 04/03/15 1922 04/04/15 0632  AST 26 25  ALT 22 16*  ALKPHOS 129* 103  BILITOT 0.9 1.1  PROT 7.2 5.8*  ALBUMIN 3.8 2.9*   No results for input(s): LIPASE, AMYLASE in the last 168 hours. No results for input(s): AMMONIA in the last 168 hours. CBC:  Recent Labs Lab 04/03/15 1922 04/03/15 1930 04/04/15 0632  WBC 7.5  --  7.2  NEUTROABS 3.6  --  3.4  HGB 17.0 18.7* 15.1  HCT 49.5 55.0* 44.2  MCV  89.7  --  90.8  PLT 202  --  203   Cardiac Enzymes: No results for input(s): CKTOTAL, CKMB, CKMBINDEX, TROPONINI in the last 168 hours. BNP (last 3 results) No results for input(s): BNP in the last 8760 hours.  ProBNP (last 3 results) No results for input(s): PROBNP in the last 8760 hours.  CBG:  Recent Labs Lab 04/03/15 1914 04/03/15 2215 04/04/15 0653 04/04/15 1107  GLUCAP 317* 326* 249* 275*    No results found for this or any previous visit (from the past 240 hour(s)).   Studies: Ct Head (brain) Wo Contrast  04/03/2015   CLINICAL DATA:  Acute onset.  Left-sided numbness.  EXAM: CT HEAD WITHOUT CONTRAST  TECHNIQUE: Contiguous axial images were obtained from the base of the skull through the vertex without intravenous contrast.  COMPARISON:  None.  FINDINGS: No acute intracranial hemorrhage. No focal mass lesion. No CT evidence of acute infarction. No midline shift or mass effect. No hydrocephalus. Basilar cisterns are patent. Paranasal sinuses and mastoid air cells are clear.  IMPRESSION: Normal head CT.   Electronically Signed   By: Suzy Bouchard M.D.   On: 04/03/2015 20:32   Mr Brain Wo Contrast  04/04/2015   CLINICAL DATA:  Transient LEFT sided numbness, currently asymptomatic. Poorly controlled hypertension.  EXAM: MRI HEAD WITHOUT CONTRAST  MRA HEAD WITHOUT CONTRAST  TECHNIQUE: Multiplanar, multiecho pulse sequences of the brain and surrounding structures were obtained without intravenous contrast. Angiographic images of the head were obtained using MRA technique without contrast.  COMPARISON:  CT of the head Apr 03, 2015  FINDINGS: MRI HEAD FINDINGS  The ventricles and sulci are normal for patient's age. No abnormal parenchymal signal, mass lesions, mass effect. No reduced diffusion to suggest acute ischemia. No susceptibility artifact to suggest hemorrhage.  No abnormal extra-axial fluid collections. No extra-axial masses though, contrast enhanced sequences would be more  sensitive. Normal major intracranial vascular flow voids seen at the skull base.  Ocular globes and orbital contents are unremarkable though not tailored for evaluation. No abnormal sellar expansion. Visualized RIGHT sphenoid and LEFT maxillary mucosal retention cyst without paranasal sinus air-fluid levels. The mastoid air cells are well aerated. No suspicious calvarial bone marrow signal. No abnormal sellar expansion. Craniocervical junction maintained.  MRA HEAD FINDINGS  Anterior circulation: Normal flow related enhancement of the included cervical, petrous, cavernous and supra clinoid internal carotid arteries. Mild luminal irregularity of the RIGHT anterior genu. Patent anterior communicating artery. Normal flow related enhancement of the anterior and middle cerebral arteries, including more distal segments.  No large vessel occlusion, high-grade stenosis, abnormal luminal irregularity, aneurysm.  Posterior circulation: Codominant vertebral arteries. Basilar artery is patent, with normal flow related enhancement of the main branch vessels. Focal apparent mid grade stenosis mid basilar artery associated with tortuosity. Small RIGHT, robust LEFT posterior communicating arteries are present. Patent bilateral posterior cerebral arteries with focal high-grade stenosis RIGHT P1 2 junction. Mild luminal regularity of the posterior cerebral arteries vessels.  IMPRESSION: MRI HEAD: No acute intracranial process ; normal noncontrast MRI of the brain for age.  MRA HEAD: No large vessel occlusion.  Midgrade stenosis mid basilar artery associated tortuosity, this less likely reflects artifact. Focal high-grade stenosis RIGHT P1 2 junction.  Luminal irregularity of the anterior genu of the RIGHT carotid siphon may reflect tiny blister aneurysm.  Mild luminal irregularity of the posterior cerebral arteries most consistent with atherosclerosis.   Electronically Signed   By: Elon Alas   On: 04/04/2015 05:43   Mr Jodene Nam  Head/brain Wo Cm  04/04/2015   CLINICAL DATA:  Transient LEFT sided numbness, currently asymptomatic. Poorly controlled hypertension.  EXAM: MRI HEAD WITHOUT CONTRAST  MRA HEAD WITHOUT CONTRAST  TECHNIQUE: Multiplanar, multiecho pulse sequences of the brain and surrounding structures were obtained without intravenous contrast. Angiographic images of the head were obtained using MRA technique without contrast.  COMPARISON:  CT of the head Apr 03, 2015  FINDINGS: MRI HEAD FINDINGS  The ventricles and sulci are normal for patient's age. No abnormal parenchymal signal, mass lesions, mass effect. No reduced diffusion to suggest acute ischemia. No susceptibility artifact to suggest hemorrhage.  No abnormal extra-axial fluid collections. No extra-axial masses though, contrast enhanced sequences would be more sensitive. Normal major intracranial vascular flow voids seen at the skull base.  Ocular globes and orbital contents are unremarkable though not tailored for evaluation. No abnormal sellar expansion. Visualized RIGHT sphenoid and LEFT maxillary mucosal retention cyst without paranasal sinus air-fluid levels. The mastoid air cells are well aerated. No suspicious calvarial bone marrow signal. No abnormal sellar expansion. Craniocervical junction maintained.  MRA HEAD FINDINGS  Anterior circulation: Normal flow related enhancement of the included cervical, petrous, cavernous and supra clinoid internal carotid arteries. Mild luminal irregularity of the RIGHT anterior genu. Patent anterior communicating  artery. Normal flow related enhancement of the anterior and middle cerebral arteries, including more distal segments.  No large vessel occlusion, high-grade stenosis, abnormal luminal irregularity, aneurysm.  Posterior circulation: Codominant vertebral arteries. Basilar artery is patent, with normal flow related enhancement of the main branch vessels. Focal apparent mid grade stenosis mid basilar artery associated with  tortuosity. Small RIGHT, robust LEFT posterior communicating arteries are present. Patent bilateral posterior cerebral arteries with focal high-grade stenosis RIGHT P1 2 junction. Mild luminal regularity of the posterior cerebral arteries vessels.  IMPRESSION: MRI HEAD: No acute intracranial process ; normal noncontrast MRI of the brain for age.  MRA HEAD: No large vessel occlusion.  Midgrade stenosis mid basilar artery associated tortuosity, this less likely reflects artifact. Focal high-grade stenosis RIGHT P1 2 junction.  Luminal irregularity of the anterior genu of the RIGHT carotid siphon may reflect tiny blister aneurysm.  Mild luminal irregularity of the posterior cerebral arteries most consistent with atherosclerosis.   Electronically Signed   By: Elon Alas   On: 04/04/2015 05:43    Scheduled Meds: . aspirin  300 mg Rectal Daily   Or  . aspirin  325 mg Oral Daily  . heparin  5,000 Units Subcutaneous 3 times per day  . insulin aspart  0-15 Units Subcutaneous TID WC  . insulin aspart  0-5 Units Subcutaneous QHS  . lisinopril  10 mg Oral Daily  . simvastatin  20 mg Oral q1800   Continuous Infusions: . sodium chloride 100 mL/hr at 04/04/15 1205    Principal Problem:   TIA (transient ischemic attack) Active Problems:   Hypertension   Diabetes mellitus, new onset    Time spent: 35 minutes.     Niel Hummer A  [facility] Pager [phone]. If 7PM-7AM, please contact night-coverage at [email or web address], password [facility] 04/04/2015, 2:42 PM  LOS: 1 day

## 2015-04-04 NOTE — Evaluation (Signed)
Occupational Therapy Evaluation and Discharge Patient Details Name: Michael Stephenson MRN: [id number] DOB: 06-23-1956 Today's Date: 04/04/2015    History of Present Illness Pt is a 59 y.o. Male admitted 04/03/15 with dizziness and left sided numbness. Pt with poorly controlled HTN and noted to have BP of 240/127 on arrival to ED as well as elevated glucose. MRI unremarkable.    Clinical Impression   PTA pt lived at home and was independent with ADLs. Pt currently at Mod I level for ADLs and all education and training completed for safety and fall prevention. No further acute OT needs.     Follow Up Recommendations  No OT follow up    Equipment Recommendations  None recommended by OT    Recommendations for Other Services       Precautions / Restrictions Restrictions Weight Bearing Restrictions: No      Mobility Bed Mobility Overal bed mobility: Independent                Transfers Overall transfer level: Modified independent Equipment used: None                  Balance Overall balance assessment: No apparent balance deficits (not formally assessed)                                          ADL Overall ADL's : Modified independent                                       General ADL Comments: Educated pt on safety with fall prevention. Educated pt on safety with decreased sensation, including skin checks, proper footwear, and safety with sharp/hot surfaces.      Vision Additional Comments: No change from baseline. Vision screen [facility]   Perception Perception Perception Tested?: No   Praxis Praxis Praxis tested?: Not tested    Pertinent Vitals/Pain Pain Assessment: No/denies pain     Hand Dominance Right   Extremity/Trunk Assessment Upper Extremity Assessment Upper Extremity Assessment: Overall WFL for tasks assessed   Lower Extremity Assessment Lower Extremity Assessment: Defer to PT evaluation   Cervical /  Trunk Assessment Cervical / Trunk Assessment: Normal   Communication Communication Communication: No difficulties   Cognition Arousal/Alertness: Awake/alert Behavior During Therapy: WFL for tasks assessed/performed Overall Cognitive Status: Within Functional Limits for tasks assessed                                Home Living Family/patient expects to be discharged to:: Private residence Living Arrangements: Spouse/significant other Available Help at Discharge: Family;Available 24 hours/day Type of Home: House Home Access: Level entry     Home Layout: One level     Bathroom Shower/Tub: Tub/shower unit Shower/tub characteristics: Architectural technologist: Standard     Home Equipment: None          Prior Functioning/Environment Level of Independence: Independent        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    OT Diagnosis: Generalized weakness;Other (comment) (impaired sensation)    End of Session  Activity Tolerance: Patient tolerated treatment well Patient left: in bed;with call bell/phone within reach   Time: 9622-2979 OT Time Calculation (min): 14 min Charges:  OT General Charges $OT Visit:  1 Procedure OT Evaluation $Initial OT Evaluation Tier I: 1 Procedure G-Codes: OT G-codes **NOT FOR INPATIENT CLASS** Functional Assessment Tool Used: clinical judgement Functional Limitation: Self care Self Care Current Status (M1586): 0 percent impaired, limited or restricted Self Care Goal Status (W2574): 0 percent impaired, limited or restricted Self Care Discharge Status [phone]): 0 percent impaired, limited or restricted  Juluis Rainier 04/04/2015, 9:25 AM   Cyndie Chime, OTR/L Occupational Therapist [phone] (pager)

## 2015-04-04 NOTE — Evaluation (Signed)
Physical Therapy Evaluation and Discharge Patient Details Name: Michael Stephenson MRN: [id number] DOB: [birth year] Today's Date: 04/04/2015   History of Present Illness  Pt is a 59 y.o. Male admitted 04/03/15 with dizziness and left sided numbness. Pt with poorly controlled HTN and noted to have BP of 240/127 on arrival to ED as well as elevated glucose. MRI unremarkable.   Clinical Impression  Patient evaluated by Physical Therapy with no further acute PT needs identified. All education has been completed and the patient has no further questions. Patient reports majority of his symptoms have resolved although still complains of some residual tingling in his toes bil. Tolerated higher level dynamic gait challenges today without need for physical assist. Reviewed importance of medical compliance at d/c for controlled BP and blood glucose. See below for any follow-up Physial Therapy or equipment needs. PT is signing off. Thank you for this referral.     Follow Up Recommendations No PT follow up    Equipment Recommendations  None recommended by PT    Recommendations for Other Services       Precautions / Restrictions Precautions Precautions: None Restrictions Weight Bearing Restrictions: No      Mobility  Bed Mobility Overal bed mobility: Independent             General bed mobility comments: sitting in chair  Transfers Overall transfer level: Modified independent Equipment used: None             General transfer comment: Mild sway noted upon standing with weight on heels but able to self correct.  Ambulation/Gait Ambulation/Gait assistance: Modified independent (Device/Increase time) Ambulation Distance (Feet): 220 Feet Assistive device: None Gait Pattern/deviations: Step-through pattern   Gait velocity interpretation: at or above normal speed for age/gender General Gait Details: Patient tolerated dynamic gait challenges including high marching, variable speeds, quick  turns, and vertical/horizontal head turns. All tasks performed without loss of balance. States his mobility has returned to baseline.  Stairs Stairs: Yes Stairs assistance: Modified independent (Device/Increase time) Stair Management: No rails;Alternating pattern;Forwards Number of Stairs: 2 General stair comments: Safely ascend/descends 2 steps similar to home environment. No issues or need for physical assist.  Wheelchair Mobility    Modified Rankin (Stroke Patients Only) Modified Rankin (Stroke Patients Only) Pre-Morbid Rankin Score: No symptoms Modified Rankin: No significant disability     Balance Overall balance assessment: Modified Independent                                           Pertinent Vitals/Pain Pain Assessment: No/denies pain    Home Living Family/patient expects to be discharged to:: Private residence Living Arrangements: Spouse/significant other Available Help at Discharge: Family;Available 24 hours/day Type of Home: House Home Access: Stairs to enter Entrance Stairs-Rails: None Entrance Stairs-Number of Steps: 2 Home Layout: One level Home Equipment: None      Prior Function Level of Independence: Independent         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, music Dominance   Dominant Hand: Right    Extremity/Trunk Assessment   Upper Extremity Assessment: Defer to OT evaluation           Lower Extremity Assessment: Defer to PT evaluation      Cervical / Trunk Assessment: Normal  Communication   Communication: No difficulties  Cognition Arousal/Alertness: Awake/alert Behavior During Therapy: WFL for tasks assessed/performed  Overall Cognitive Status: Within Functional Limits for tasks assessed                      General Comments General comments (skin integrity, edema, etc.): Able to perform rhomberg stance eyes open/closed, single leg stance, and tandem stance. No loss of balance needing  physical assist. minimal sway with each test. Educated on importance of compliance with medical recommendations for BP and glucose control. Pt verbalizes understanding to follow up with PCP regularly for check-ups, perform exercises at home,and to eat a health diet.    Exercises        Assessment/Plan    PT Assessment Patent does not need any further PT services  PT Diagnosis Other (comment);Difficulty walking (admitted for dizziness and Lt side numbness)   PT Problem List    PT Treatment Interventions     PT Goals (Current goals can be found in the Care Plan section) Acute Rehab PT Goals Patient Stated Goal: Go home PT Goal Formulation: All assessment and education complete, DC therapy    Frequency     Barriers to discharge        Co-evaluation               End of Session   Activity Tolerance: Patient tolerated treatment well Patient left: in chair;with call bell/phone within reach Nurse Communication: Mobility status    Functional Assessment Tool Used: clinical observation Functional Limitation: Mobility: Walking and moving around Mobility: Walking and Moving Around Current Status (P5361): At least 1 percent but less than 20 percent impaired, limited or restricted Mobility: Walking and Moving Around Goal Status [phone]): At least 1 percent but less than 20 percent impaired, limited or restricted Mobility: Walking and Moving Around Discharge Status [phone]): At least 1 percent but less than 20 percent impaired, limited or restricted    Time: 0927-0940 PT Time Calculation (min) (ACUTE ONLY): 13 min   Charges:   PT Evaluation $Initial PT Evaluation Tier I: 1 Procedure     PT G Codes:   PT G-Codes **NOT FOR INPATIENT CLASS** Functional Assessment Tool Used: clinical observation Functional Limitation: Mobility: Walking and moving around Mobility: Walking and Moving Around Current Status (P6195): At least 1 percent but less than 20 percent impaired, limited or  restricted Mobility: Walking and Moving Around Goal Status [phone]): At least 1 percent but less than 20 percent impaired, limited or restricted Mobility: Walking and Moving Around Discharge Status [phone]): At least 1 percent but less than 20 percent impaired, limited or restricted    Ellouise Newer 04/04/2015, 10:06 AM Elayne Snare, [place]

## 2015-04-04 NOTE — Progress Notes (Signed)
STROKE TEAM PROGRESS NOTE   HISTORY Michael Stephenson is a 59 y.o. male hx of HTN presenting for evaluation of left sided sensory loss. Symptoms started while eating dinner, lasted less than 10 minutes and have now completely resolved. Denies any associated weakness, visual or speech deficits. No difficulty walking. No prior TIA or stroke. Upon arrival to ED noted to have BP of 240/127.   CT head imaging reviewed and shows no acute process.   Date last known well: 04/03/2015 Time last known well: 1700 tPA Given: no, symptoms resolved   SUBJECTIVE (INTERVAL HISTORY) No family members present. Dr. Leonie Man explained to the patient that he probably had a TIA. The patient stated that he had not been taking his aspirin every day. Dr. Leonie Man informed the patient had he was now at higher risk for a stroke and that it was very important that he not miss any doses of his aspirin. The patient feels he is back to baseline.   OBJECTIVE Temp:  [98 F (36.7 C)-98.8 F (37.1 C)] 98.2 F (36.8 C) (05/15 0921) Pulse Rate:  [61-102] 80 (05/15 0921) Cardiac Rhythm:  [-] Normal sinus rhythm (05/15 1000) Resp:  [11-18] 18 (05/15 0921) BP: (109-240)/(69-127) 114/85 mmHg (05/15 0921) SpO2:  [0 %-99 %] 95 % (05/15 0921) Weight:  [86.3 kg (190 lb 4.1 oz)-87.119 kg (192 lb 1 oz)] 86.3 kg (190 lb 4.1 oz) (05/14 2152)   Recent Labs Lab 04/03/15 1914 04/03/15 2215 04/04/15 0653 04/04/15 1107  GLUCAP 317* 326* 249* 275*    Recent Labs Lab 04/03/15 1922 04/03/15 1930 04/04/15 0632  NA 136 139 139  K 3.9 3.8 3.4*  CL 100* 99* 103  CO2 26  --  25  GLUCOSE 361* 358* 267*  BUN 9 10 13   CREATININE 1.19 1.20 0.99  CALCIUM 9.1  --  8.4*    Recent Labs Lab 04/03/15 1922 04/04/15 0632  AST 26 25  ALT 22 16*  ALKPHOS 129* 103  BILITOT 0.9 1.1  PROT 7.2 5.8*  ALBUMIN 3.8 2.9*    Recent Labs Lab 04/03/15 1922 04/03/15 1930 04/04/15 0632  WBC 7.5  --  7.2  NEUTROABS 3.6  --  3.4  HGB 17.0 18.7*  15.1  HCT 49.5 55.0* 44.2  MCV 89.7  --  90.8  PLT 202  --  203   No results for input(s): CKTOTAL, CKMB, CKMBINDEX, TROPONINI in the last 168 hours.  Recent Labs  04/03/15 1922  LABPROT 13.5  INR 1.02   No results for input(s): COLORURINE, LABSPEC, PHURINE, GLUCOSEU, HGBUR, BILIRUBINUR, KETONESUR, PROTEINUR, UROBILINOGEN, NITRITE, LEUKOCYTESUR in the last 72 hours.  Invalid input(s): APPERANCEUR     Component Value Date/Time   CHOL 210* 04/04/2015 0632   TRIG 339* 04/04/2015 0632   HDL 31* 04/04/2015 0632   CHOLHDL 6.8 04/04/2015 0632   VLDL 68* 04/04/2015 0632   LDLCALC 111* 04/04/2015 0632   No results found for: HGBA1C    Component Value Date/Time   LABOPIA NONE DETECTED 04/04/2015 1108   COCAINSCRNUR NONE DETECTED 04/04/2015 1108   LABBENZ NONE DETECTED 04/04/2015 1108   AMPHETMU NONE DETECTED 04/04/2015 1108   THCU NONE DETECTED 04/04/2015 1108   LABBARB NONE DETECTED 04/04/2015 1108    No results for input(s): ETH in the last 168 hours.  Ct Head (brain) Wo Contrast 04/03/2015    Normal head CT.    Mr Brain Wo Contrast 04/04/2015     . Afebrile. Head is nontraumatic. Neck is supple without  bruit.    Cardiac exam no murmur or gallop. Lungs are clear to auscultation. Distal pulses are well felt. MRI HEAD:  No acute intracranial process ; normal noncontrast MRI of the brain for age.    MRA HEAD:  No large vessel occlusion.   Midgrade stenosis mid basilar artery associated tortuosity, this less likely reflects artifact.  Focal high-grade stenosis RIGHT P1 2 junction.   Luminal irregularity of the anterior genu of the RIGHT carotid siphon may reflect tiny blister aneurysm.   Mild luminal irregularity of the posterior cerebral arteries most consistent with atherosclerosis.       PHYSICAL EXAM Obese middle aged male not in distress. . Afebrile. Head is nontraumatic. Neck is supple without bruit.    Cardiac exam no murmur or gallop. Lungs are clear to  auscultation. Distal pulses are well felt. Neurological Exam ;  Awake  Alert oriented x 3. Normal speech and language.eye movements full without nystagmus.fundi were not visualized. Vision acuity and fields appear normal. Hearing is normal. Palatal movements are normal. Face symmetric. Tongue midline. Normal strength, tone, reflexes and coordination. Normal sensation. Gait deferred.    ASSESSMENT/PLAN [name] is a 59 y.o. male with history of hypertension presenting with transient left hemisensory deficits. He did not receive IV t-PA due to resolution of deficits.   TIA:  Non-dominant   Resultant  deficits resolved  MRI  as above no acute intracranial process  MRA  for high-grade stenosis right P1 2 junction  Carotid Doppler  1-39% ICA stenosis. Vertebral artery flow is antegrade.  2D Echo pending  LDL 111  HgbA1c pending   Subcutaneous heparin for VTE prophylaxis Diet heart healthy/carb modified Room service appropriate?: Yes; Fluid consistency:: Thin  no antithrombotic prior to admission, now on aspirin 325 mg orally every day   Patient counseled to be compliant with her antithrombotic medications  Ongoing aggressive stroke risk factor management  Therapy recommendations: No follow-up physical or occupational therapy.  Disposition:  Pending  Hypertension  Home meds: Tenormin, lisinopril and hydrochlorothiazide  Blood pressure mildly low on lisinopril.   Hyperlipidemia  Home meds:  No lipid lowering medications prior to admission.  LDL 111, goal < 70  Now on Zocor 20 mg daily  Continue statin at discharge   Other Stroke Risk Factors  Cigarette smoker, quit smoking 22 years ago.  Obesity, Body mass index is 32.64 kg/(m^2).    Other Active Problems  Elevated glucose levels - await hemoglobin A1c  The patient does not have a primary care physician.   PLAN  Okay to proceed with discharge from stroke standpoint once results from 2-D  echo are available.  Hospital day # 1  Mikey Bussing PA-C [facility] Pager [phone] 04/04/2015, 1:30 PM I have personally examined this patient, reviewed notes, independently viewed imaging studies, participated in medical decision making and plan of care. I have made any additions or clarifications directly to the above note. Agree with note above.  He presented with transient left body numbness likely from TIA from small vessel disease and remains at risk for recurrent strokes/TIA and neurological worsening. Continue ongoing stroke evaluation and agrressive risk factor modification.   Antony Contras, MD Medical Director [place] Pager: [phone] 04/04/2015 4:12 PM     To contact Stroke Continuity provider, please refer to [email or web address] After hours, contact General Neurology

## 2015-04-05 ENCOUNTER — Ambulatory Visit (HOSPITAL_BASED_OUTPATIENT_CLINIC_OR_DEPARTMENT_OTHER): Payer: 59

## 2015-04-05 DIAGNOSIS — G459 Transient cerebral ischemic attack, unspecified: Secondary | ICD-10-CM

## 2015-04-05 DIAGNOSIS — G458 Other transient cerebral ischemic attacks and related syndromes: Secondary | ICD-10-CM | POA: Diagnosis not present

## 2015-04-05 DIAGNOSIS — I10 Essential (primary) hypertension: Secondary | ICD-10-CM | POA: Diagnosis not present

## 2015-04-05 DIAGNOSIS — E119 Type 2 diabetes mellitus without complications: Secondary | ICD-10-CM | POA: Diagnosis not present

## 2015-04-05 LAB — GLUCOSE, CAPILLARY
Glucose-Capillary: 204 mg/dL — ABNORMAL HIGH (ref 65–99)
Glucose-Capillary: 216 mg/dL — ABNORMAL HIGH (ref 65–99)

## 2015-04-05 LAB — HEMOGLOBIN A1C
Hgb A1c MFr Bld: 11.5 % — ABNORMAL HIGH (ref 4.8–5.6)
Mean Plasma Glucose: 283 mg/dL

## 2015-04-05 MED ORDER — METFORMIN HCL 500 MG PO TABS: ORAL_TABLET | ORAL | Status: DC

## 2015-04-05 MED ORDER — LISINOPRIL-HYDROCHLOROTHIAZIDE 20-25 MG PO TABS
1.0000 | ORAL_TABLET | Freq: Every day | ORAL | Status: DC
Start: 2015-04-05 — End: 2016-04-28

## 2015-04-05 MED ORDER — GLIPIZIDE 5 MG PO TABS: 2.5000 mg | ORAL_TABLET | Freq: Two times a day (BID) | ORAL | Status: DC

## 2015-04-05 MED ORDER — SIMVASTATIN 20 MG PO TABS: 20.0000 mg | ORAL_TABLET | Freq: Every day | ORAL | Status: DC

## 2015-04-05 MED ORDER — LISINOPRIL 10 MG PO TABS
10.0000 mg | ORAL_TABLET | Freq: Every day | ORAL | Status: DC
  Administered 2015-04-05: 10 mg via ORAL
  Filled 2015-04-05: qty 1

## 2015-04-05 MED ORDER — LIVING WELL WITH DIABETES BOOK
Freq: Once | Status: AC
  Administered 2015-04-05: 10:00:00
  Filled 2015-04-05 (×2): qty 1

## 2015-04-05 MED ORDER — ASPIRIN EC 81 MG PO TBEC: 81.0000 mg | DELAYED_RELEASE_TABLET | Freq: Every day | ORAL | Status: DC

## 2015-04-05 MED ORDER — BLOOD GLUCOSE METER KIT: PACK | Status: AC

## 2015-04-05 NOTE — Progress Notes (Addendum)
STROKE TEAM PROGRESS NOTE   HISTORY Michael Stephenson is a 59 y.o. male hx of HTN presenting for evaluation of left sided sensory loss. Symptoms started while eating dinner, lasted less than 10 minutes and have now completely resolved. Denies any associated weakness, visual or speech deficits. No difficulty walking. No prior TIA or stroke. Upon arrival to ED noted to have BP of 240/127.   CT head imaging reviewed and shows no acute process.   Date last known well: 04/03/2015 Time last known well: 1700 tPA Given: no, symptoms resolved   SUBJECTIVE (INTERVAL HISTORY) No family members present.  The patient feels he is back to baseline.   OBJECTIVE Temp:  [97.9 F (36.6 C)-98.6 F (37 C)] 98.2 F (36.8 C) (05/16 0918) Pulse Rate:  [60-77] 77 (05/16 0918) Cardiac Rhythm:  [-] Normal sinus rhythm (05/16 0403) Resp:  [18-20] 20 (05/16 0918) BP: (105-140)/(61-82) 136/79 mmHg (05/16 0918) SpO2:  [96 %-99 %] 98 % (05/16 0918)   Recent Labs Lab 04/04/15 0653 04/04/15 1107 04/04/15 1616 04/04/15 2253 04/05/15 0626  GLUCAP 249* 275* 213* 221* 204*    Recent Labs Lab 04/03/15 1922 04/03/15 1930 04/04/15 0632  NA 136 139 139  K 3.9 3.8 3.4*  CL 100* 99* 103  CO2 26  --  25  GLUCOSE 361* 358* 267*  BUN 9 10 13   CREATININE 1.19 1.20 0.99  CALCIUM 9.1  --  8.4*    Recent Labs Lab 04/03/15 1922 04/04/15 0632  AST 26 25  ALT 22 16*  ALKPHOS 129* 103  BILITOT 0.9 1.1  PROT 7.2 5.8*  ALBUMIN 3.8 2.9*    Recent Labs Lab 04/03/15 1922 04/03/15 1930 04/04/15 0632  WBC 7.5  --  7.2  NEUTROABS 3.6  --  3.4  HGB 17.0 18.7* 15.1  HCT 49.5 55.0* 44.2  MCV 89.7  --  90.8  PLT 202  --  203   No results for input(s): CKTOTAL, CKMB, CKMBINDEX, TROPONINI in the last 168 hours.  Recent Labs  04/03/15 1922  LABPROT 13.5  INR 1.02   No results for input(s): COLORURINE, LABSPEC, PHURINE, GLUCOSEU, HGBUR, BILIRUBINUR, KETONESUR, PROTEINUR, UROBILINOGEN, NITRITE, LEUKOCYTESUR  in the last 72 hours.  Invalid input(s): APPERANCEUR     Component Value Date/Time   CHOL 210* 04/04/2015 0632   TRIG 339* 04/04/2015 0632   HDL 31* 04/04/2015 0632   CHOLHDL 6.8 04/04/2015 0632   VLDL 68* 04/04/2015 0632   LDLCALC 111* 04/04/2015 0632   No results found for: HGBA1C    Component Value Date/Time   LABOPIA NONE DETECTED 04/04/2015 1108   COCAINSCRNUR NONE DETECTED 04/04/2015 1108   LABBENZ NONE DETECTED 04/04/2015 1108   AMPHETMU NONE DETECTED 04/04/2015 1108   THCU NONE DETECTED 04/04/2015 1108   LABBARB NONE DETECTED 04/04/2015 1108    No results for input(s): ETH in the last 168 hours.  Ct Head (brain) Wo Contrast 04/03/2015    Normal head CT.    Mr Brain Wo Contrast 04/04/2015     . Afebrile. Head is nontraumatic. Neck is supple without bruit.    Cardiac exam no murmur or gallop. Lungs are clear to auscultation. Distal pulses are well felt. MRI HEAD:  No acute intracranial process ; normal noncontrast MRI of the brain for age.    MRA HEAD:  No large vessel occlusion.   Midgrade stenosis mid basilar artery associated tortuosity, this less likely reflects artifact.  Focal high-grade stenosis RIGHT P1 2 junction.   Luminal irregularity  of the anterior genu of the RIGHT carotid siphon may reflect tiny blister aneurysm.   Mild luminal irregularity of the posterior cerebral arteries most consistent with atherosclerosis.       PHYSICAL EXAM Obese middle aged male not in distress. . Afebrile. Head is nontraumatic. Neck is supple without bruit.    Cardiac exam no murmur or gallop. Lungs are clear to auscultation. Distal pulses are well felt. Neurological Exam ;  Awake  Alert oriented x 3. Normal speech and language.eye movements full without nystagmus.fundi were not visualized. Vision acuity and fields appear normal. Hearing is normal. Palatal movements are normal. Face symmetric. Tongue midline. Normal strength, tone, reflexes and coordination. Normal  sensation. Gait deferred.    ASSESSMENT/PLAN [name] is a 59 y.o. male with history of hypertension presenting with transient left hemisensory deficits. He did not receive IV t-PA due to resolution of deficits.   TIA:  Non-dominant   Resultant  deficits resolved  MRI  as above no acute intracranial process  MRA  for high-grade stenosis right P1 2 junction  Carotid Doppler  1-39% ICA stenosis. Vertebral artery flow is antegrade.  2D Echo pending  LDL 111  HgbA1c pending   Subcutaneous heparin for VTE prophylaxis Diet heart healthy/carb modified Room service appropriate?: Yes; Fluid consistency:: Thin  no antithrombotic prior to admission, now on aspirin 325 mg orally every day   Patient counseled to be compliant with her antithrombotic medications  Ongoing aggressive stroke risk factor management  Therapy recommendations: No follow-up physical or occupational therapy.  Disposition:  home  Hypertension  Home meds: Tenormin, lisinopril and hydrochlorothiazide  Blood pressure mildly low on lisinopril.   Hyperlipidemia  Home meds:  No lipid lowering medications prior to admission.  LDL 111, goal < 70  Now on Zocor 20 mg daily  Continue statin at discharge   Other Stroke Risk Factors  Cigarette smoker, quit smoking 22 years ago.  Obesity, Body mass index is 32.64 kg/(m^2).    Other Active Problems  Elevated glucose levels - await hemoglobin A1c  The patient does not have a primary care physician.   PLAN  Okay to proceed with discharge from stroke standpoint once results from 2-D echo are available.  Hospital day # 2       I have personally examined this patient, reviewed notes, independently viewed imaging studies, participated in medical decision making and plan of care. I have made any additions or clarifications directly to the above note. Agree with note above.  He presented with transient left body numbness likely from TIA from  small vessel disease  . Continue ongoing stroke evaluation and agrressive risk factor modification. Follow up in stroke clinic in 2 months.Stroke service will sign off. Call for questions.   Antony Contras, MD Medical Director [facility] Pager: [phone] 04/05/2015 11:26 AM     To contact Stroke Continuity provider, please refer to [email or web address] After hours, contact General Neurology

## 2015-04-05 NOTE — Progress Notes (Signed)
Met with patient to discuss discharge follow-up.  Patient was agreeable to following up at the [facility] to establish a PCP.  Appontment was made for 04/09/15 at 1600.  Patient is aware that he will be able to utilize the clinic pharmacy to fill his medications at discharge.

## 2015-04-05 NOTE — Progress Notes (Addendum)
Inpatient Diabetes Program Recommendations  AACE/ADA: New Consensus Statement on Inpatient Glycemic Control (2013)  Target Ranges:  Prepandial:   less than 140 mg/dL      Peak postprandial:   less than 180 mg/dL (1-2 hours)      Critically ill patients:  140 - 180 mg/dL   Results for Michael Stephenson, Michael Stephenson (MRN [id number]) as of 04/05/2015 11:37  Ref. Range 04/04/2015 06:53 04/04/2015 11:07 04/04/2015 16:16 04/04/2015 22:53 04/05/2015 06:26 04/05/2015 11:17  Glucose-Capillary Latest Ref Range: 65-99 mg/dL 249 (H) 275 (H) 213 (H) 221 (H) 204 (H) 216 (H)   Diabetes history: New diagnosis Current orders for Inpatient glycemic control: Glipizide 2.5 mg BID, Novolog 0-15 units TID, Novolog 0-5 units QHS  Inpatient Diabetes Program Recommendations Insulin - Basal: Patient consistently in the 200's with Glipizide BID. Please consider Lantus 10 units Q24 hrs.    1400 pm Consult Note: Spoke with patient about his glucose levels coming into the hospital. Spoke with pt about his new diabetes diagnosis. Discussed what an A1C is, basic pathophysiology of DM Type 2, basic home care, importance of checking CBGs and maintaining good CBG control to prevent long-term and short-term complications. I told him to find out what his A1c level is before leaving the hospital. Reviewed signs and symptoms of hyperglycemia and hypoglycemia along with treatment for both. Discussed impact of nutrition, exercise, and medications on diabetes control.  Discussed carbohydrates, carbohydrate goals per day and meal, along with portion sizes and discussed the plate method. RNs to provide ongoing basic DM education at bedside with this patient and engage patient to actively check blood glucose and administer insulin injections. Have ordered educational booklet and DM videos. Will wait on A1c level to see possible outpatient medications for glucose control.  MD: at time of discharge please order glucose meter starter kit (order # [id number]) along  with the other medication and supplies needed.  Thanks,  Tama Headings RN, MSN, [facility] Inpatient Diabetes Coordinator Team Pager [phone]

## 2015-04-05 NOTE — Progress Notes (Signed)
  Echocardiogram 2D Echocardiogram has been performed.  Bobbye Charleston 04/05/2015, 12:37 PM

## 2015-04-05 NOTE — Evaluation (Signed)
Speech Language Pathology Evaluation Patient Details Name: Michael Stephenson MRN: [id number] DOB: 02-10-1956 Today's Date: 04/05/2015 Time: 9470-9628 SLP Time Calculation (min) (ACUTE ONLY): 11 min  Problem List:  Patient Active Problem List   Diagnosis Date Noted  . Other specified transient cerebral ischemias   . Paresthesias   . Diabetes mellitus, new onset 04/03/2015  . TIA (transient ischemic attack) 04/03/2015  . Postop check 04/16/2012  . Hypertension 04/16/2012   Past Medical History:  Past Medical History  Diagnosis Date  . Hypertension   . Heart murmur    Past Surgical History:  Past Surgical History  Procedure Laterality Date  . Laparoscopic appendectomy  03/30/2012    Procedure: APPENDECTOMY LAPAROSCOPIC;  Surgeon: Gwenyth Ober, MD;  Location: [place];  Service: General;  Laterality: N/A;  . Appendectomy  03/30/2012   HPI:  59 year old male admitted with left sided sensory loss, lasting 10 minutes with complete resolve. CT of the head negative for acute process.    Assessment / Plan / Recommendation Clinical Impression  Cognitive-linguistic evaluation complete with function being normal at this time. No SLP f/u indicated.     SLP Assessment  Patient does not need any further Speech Lanaguage Pathology Services    Follow Up Recommendations  None       Pertinent Vitals/Pain Pain Assessment: No/denies pain       SLP Evaluation Prior Functioning  Cognitive/Linguistic Baseline: Within functional limits Type of Home: House  Lives With: Spouse Available Help at Discharge: Family;Available 24 hours/day   Cognition  Overall Cognitive Status: Within Functional Limits for tasks assessed    Comprehension  Auditory Comprehension Overall Auditory Comprehension: Appears within functional limits for tasks assessed Reading Comprehension Reading Status: Not tested    Expression Expression Primary Mode of Expression: Verbal Verbal Expression Overall Verbal  Expression: Appears within functional limits for tasks assessed Written Expression Dominant Hand: Right Written Expression: Not tested   Oral / Motor Oral Motor/Sensory Function Overall Oral Motor/Sensory Function: Appears within functional limits for tasks assessed Motor Speech Overall Motor Speech: Appears within functional limits for tasks assessed   GO Functional Assessment Tool Used: skilled clinical judgement Functional Limitations: Spoken language expressive Spoken Language Expression Current Status (Z6629): 0 percent impaired, limited or restricted Spoken Language Expression Goal Status (U7654): 0 percent impaired, limited or restricted Spoken Language Expression Discharge Status [phone]): 0 percent impaired, limited or restricted  Gabriel Rainwater MA, CCC-SLP [phone]  Dawson Albers Meryl 04/05/2015, 1:12 PM

## 2015-04-05 NOTE — Discharge Summary (Signed)
Physician Discharge Summary  [name] CWC:[id number] DOB: 1956-09-30 DOA: 04/03/2015  PCP: No primary care provider on file.  Admit date: 04/03/2015 Discharge date: 04/05/2015  Time spent: 35 minutes  Recommendations for Outpatient Follow-up:  1. Needs further adjustment of diabetes medications. Might need to be started on insulin. 2. Needs B-met to follow renal function, now that he is on diuretics.    Discharge Diagnoses:    Acute stroke   Hypertension   Diabetes mellitus, new onset   Other specified transient cerebral ischemias   Paresthesias   Discharge Condition: stable  Diet recommendation: heart healthy  Filed Weights   04/03/15 1917 04/03/15 2152  Weight: 87.119 kg (192 lb 1 oz) 86.3 kg (190 lb 4.1 oz)    History of present illness:  Michael Stephenson is a 59 y.o. male with Past medical history of essential hypertension. The patient is presenting with numbness of numbness of left arm and leg. This started at around 5 PM. Patient was eating dinner and while he was walking he started having difficulty with walking as well as sensation. He denies any headache or dizziness. Denies any speech slurring. Denies any chest pain chest 7**no shortness of breath cough fever chills nausea vomiting diarrhea or burning urination. He does not smoke does not do alcohol or no drugs. He also mentions that since last few months he has been having increasing frequency of urination as well as increased thirst. He also has lost 20 pounds. He denies any medication use at present. Does not see any physician on regular basis.  Hospital Course:  1-Acute Stroke;  Continue with aspirin.  BP and diabetes controlled.  Carotid Doppler 1-39% ICA stenosis.  ECHO no source of embolism \ 2-Diabetes;  Will start glipizide and metformin Patient prefer to be on oral medication for now. Needs further adjustment of medications. .  HbA1c at 11.  3-HLD;  Started on simvastatin.    Hypertension Will resume lisinopril and HCTZ.   Procedures: ECHO; Left ventricle: The cavity size was normal. Systolic function was normal. The estimated ejection fraction was in the range of 55% to 60%. Wall motion was normal; there were no regional wall motion abnormalities. Left ventricular diastolic function parameters were normal.  Carotid; 1-39% ICA stenosis. Vertebral artery flow is antegrade.  Consultations:  neurology  Discharge Exam: Filed Vitals:   04/05/15 1349  BP: 164/91  Pulse: 73  Temp: 98.4 F (36.9 C)  Resp: 18    General: NAD Cardiovascular: S 1, S 2 RRR Respiratory: CTA  Discharge Instructions   Discharge Instructions    Diet Carb Modified    Complete by:  As directed      Increase activity slowly    Complete by:  As directed           Current Discharge Medication List    START taking these medications   Details  aspirin EC 81 MG tablet Take 1 tablet (81 mg total) by mouth daily. Qty: 30 tablet, Refills: 0    glipiZIDE (GLUCOTROL) 5 MG tablet Take 0.5 tablets (2.5 mg total) by mouth 2 (two) times daily before a meal. Qty: 60 tablet, Refills: 0    lisinopril-hydrochlorothiazide (PRINZIDE,ZESTORETIC) 20-25 MG per tablet Take 1 tablet by mouth daily. Qty: 30 tablet, Refills: 0    metFORMIN (GLUCOPHAGE) 500 MG tablet Take 1 tablet twice a day for 5 days then take 2 tablet twice a daily Qty: 120 tablet, Refills: 0    simvastatin (ZOCOR) 20 MG tablet Take 1  tablet (20 mg total) by mouth daily at 6 PM. Qty: 30 tablet, Refills: 0       No Known Allergies Follow-up Information    Follow up with SETHI,PRAMOD, MD In 2 months.   Specialties:  Neurology, Radiology   Why:  stroke clinic, call earlier if symptoms worsen or new neurologica, office will call you for follow up appointment   Contact information:   [address] [place] [address] [phone]       Follow up with [facility]. Go on 04/09/2015.   Why:  Arrive at 3:45 for a 4pm appointment. Bring all discharge papers, photo ID and a $20 copay.   Contact information:   [address]  [place] [address] [phone]       The results of significant diagnostics from this hospitalization (including imaging, microbiology, ancillary and laboratory) are listed below for reference.    Significant Diagnostic Studies: Ct Head (brain) Wo Contrast  04/03/2015   CLINICAL DATA:  Acute onset.  Left-sided numbness.  EXAM: CT HEAD WITHOUT CONTRAST  TECHNIQUE: Contiguous axial images were obtained from the base of the skull through the vertex without intravenous contrast.  COMPARISON:  None.  FINDINGS: No acute intracranial hemorrhage. No focal mass lesion. No CT evidence of acute infarction. No midline shift or mass effect. No hydrocephalus. Basilar cisterns are patent. Paranasal sinuses and mastoid air cells are clear.  IMPRESSION: Normal head CT.   Electronically Signed   By: Suzy Bouchard M.D.   On: 04/03/2015 20:32   Mr Brain Wo Contrast  04/04/2015   CLINICAL DATA:  Transient LEFT sided numbness, currently asymptomatic. Poorly controlled hypertension.  EXAM: MRI HEAD WITHOUT CONTRAST  MRA HEAD WITHOUT CONTRAST  TECHNIQUE: Multiplanar, multiecho pulse sequences of the brain and surrounding structures were obtained without intravenous contrast. Angiographic images of the head were obtained using MRA technique without contrast.  COMPARISON:  CT of the head Apr 03, 2015  FINDINGS: MRI HEAD FINDINGS  The ventricles and sulci are normal for patient's age. No abnormal parenchymal signal, mass lesions, mass effect. No reduced diffusion to suggest acute ischemia. No susceptibility artifact to suggest hemorrhage.  No abnormal extra-axial fluid collections. No extra-axial masses though, contrast enhanced sequences would be more sensitive. Normal major intracranial vascular flow voids seen at the skull base.   Ocular globes and orbital contents are unremarkable though not tailored for evaluation. No abnormal sellar expansion. Visualized RIGHT sphenoid and LEFT maxillary mucosal retention cyst without paranasal sinus air-fluid levels. The mastoid air cells are well aerated. No suspicious calvarial bone marrow signal. No abnormal sellar expansion. Craniocervical junction maintained.  MRA HEAD FINDINGS  Anterior circulation: Normal flow related enhancement of the included cervical, petrous, cavernous and supra clinoid internal carotid arteries. Mild luminal irregularity of the RIGHT anterior genu. Patent anterior communicating artery. Normal flow related enhancement of the anterior and middle cerebral arteries, including more distal segments.  No large vessel occlusion, high-grade stenosis, abnormal luminal irregularity, aneurysm.  Posterior circulation: Codominant vertebral arteries. Basilar artery is patent, with normal flow related enhancement of the main branch vessels. Focal apparent mid grade stenosis mid basilar artery associated with tortuosity. Small RIGHT, robust LEFT posterior communicating arteries are present. Patent bilateral posterior cerebral arteries with focal high-grade stenosis RIGHT P1 2 junction. Mild luminal regularity of the posterior cerebral arteries vessels.  IMPRESSION: MRI HEAD: No acute intracranial process ; normal noncontrast MRI of the brain for age.  MRA HEAD:  No large vessel occlusion.  Midgrade stenosis mid basilar artery associated tortuosity, this less likely reflects artifact. Focal high-grade stenosis RIGHT P1 2 junction.  Luminal irregularity of the anterior genu of the RIGHT carotid siphon may reflect tiny blister aneurysm.  Mild luminal irregularity of the posterior cerebral arteries most consistent with atherosclerosis.   Electronically Signed   By: Elon Alas   On: 04/04/2015 05:43   Mr Jodene Nam Head/brain Wo Cm  04/04/2015   CLINICAL DATA:  Transient LEFT sided numbness,  currently asymptomatic. Poorly controlled hypertension.  EXAM: MRI HEAD WITHOUT CONTRAST  MRA HEAD WITHOUT CONTRAST  TECHNIQUE: Multiplanar, multiecho pulse sequences of the brain and surrounding structures were obtained without intravenous contrast. Angiographic images of the head were obtained using MRA technique without contrast.  COMPARISON:  CT of the head Apr 03, 2015  FINDINGS: MRI HEAD FINDINGS  The ventricles and sulci are normal for patient's age. No abnormal parenchymal signal, mass lesions, mass effect. No reduced diffusion to suggest acute ischemia. No susceptibility artifact to suggest hemorrhage.  No abnormal extra-axial fluid collections. No extra-axial masses though, contrast enhanced sequences would be more sensitive. Normal major intracranial vascular flow voids seen at the skull base.  Ocular globes and orbital contents are unremarkable though not tailored for evaluation. No abnormal sellar expansion. Visualized RIGHT sphenoid and LEFT maxillary mucosal retention cyst without paranasal sinus air-fluid levels. The mastoid air cells are well aerated. No suspicious calvarial bone marrow signal. No abnormal sellar expansion. Craniocervical junction maintained.  MRA HEAD FINDINGS  Anterior circulation: Normal flow related enhancement of the included cervical, petrous, cavernous and supra clinoid internal carotid arteries. Mild luminal irregularity of the RIGHT anterior genu. Patent anterior communicating artery. Normal flow related enhancement of the anterior and middle cerebral arteries, including more distal segments.  No large vessel occlusion, high-grade stenosis, abnormal luminal irregularity, aneurysm.  Posterior circulation: Codominant vertebral arteries. Basilar artery is patent, with normal flow related enhancement of the main branch vessels. Focal apparent mid grade stenosis mid basilar artery associated with tortuosity. Small RIGHT, robust LEFT posterior communicating arteries are present.  Patent bilateral posterior cerebral arteries with focal high-grade stenosis RIGHT P1 2 junction. Mild luminal regularity of the posterior cerebral arteries vessels.  IMPRESSION: MRI HEAD: No acute intracranial process ; normal noncontrast MRI of the brain for age.  MRA HEAD: No large vessel occlusion.  Midgrade stenosis mid basilar artery associated tortuosity, this less likely reflects artifact. Focal high-grade stenosis RIGHT P1 2 junction.  Luminal irregularity of the anterior genu of the RIGHT carotid siphon may reflect tiny blister aneurysm.  Mild luminal irregularity of the posterior cerebral arteries most consistent with atherosclerosis.   Electronically Signed   By: Elon Alas   On: 04/04/2015 05:43    Microbiology: No results found for this or any previous visit (from the past 240 hour(s)).   Labs: Basic Metabolic Panel:  Recent Labs Lab 04/03/15 1922 04/03/15 1930 04/04/15 0632  NA 136 139 139  K 3.9 3.8 3.4*  CL 100* 99* 103  CO2 26  --  25  GLUCOSE 361* 358* 267*  BUN '9 10 13  ' CREATININE 1.19 1.20 0.99  CALCIUM 9.1  --  8.4*   Liver Function Tests:  Recent Labs Lab 04/03/15 1922 04/04/15 0632  AST 26 25  ALT 22 16*  ALKPHOS 129* 103  BILITOT 0.9 1.1  PROT 7.2 5.8*  ALBUMIN 3.8 2.9*   No results for input(s): LIPASE, AMYLASE in the last 168 hours. No  results for input(s): AMMONIA in the last 168 hours. CBC:  Recent Labs Lab 04/03/15 1922 04/03/15 1930 04/04/15 0632  WBC 7.5  --  7.2  NEUTROABS 3.6  --  3.4  HGB 17.0 18.7* 15.1  HCT 49.5 55.0* 44.2  MCV 89.7  --  90.8  PLT 202  --  203   Cardiac Enzymes: No results for input(s): CKTOTAL, CKMB, CKMBINDEX, TROPONINI in the last 168 hours. BNP: BNP (last 3 results) No results for input(s): BNP in the last 8760 hours.  ProBNP (last 3 results) No results for input(s): PROBNP in the last 8760 hours.  CBG:  Recent Labs Lab 04/04/15 1107 04/04/15 1616 04/04/15 2253 04/05/15 0626  04/05/15 1117  GLUCAP 275* 213* 221* 204* 216*       Signed:  Cerrone Debold A  [facility] 04/05/2015, 4:11 PM

## 2015-04-05 NOTE — Progress Notes (Signed)
  RD consulted for nutrition education regarding diabetes.   No results found for: HGBA1C  RD provided "Carbohydrate Counting for People with Diabetes" and "Nutrition Therapy for Elevated Triglycerides" handouts from the Academy of Nutrition and Dietetics. Discussed different food groups and their effects on blood sugar, emphasizing carbohydrate-containing foods. Provided list of carbohydrates and recommended serving sizes of common foods.  Discussed importance of controlled and consistent carbohydrate intake throughout the day. Provided examples of ways to balance meals/snacks and encouraged intake of high-fiber, whole grain complex carbohydrates. Teach back method used. Pt states he was drinking a lot of soda PTA- he plans to eliminate soda or drink diet instead.   Expect good compliance.  Body mass index is 32.64 kg/(m^2). Pt meets criteria for Obesity based on current BMI. He reports eating well PTA.   Current diet order is Heart Healthy/Carb Modified, patient is consuming approximately 100% of meals at this time. Labs and medications reviewed. No further nutrition interventions warranted at this time. RD contact information provided. If additional nutrition issues arise, please re-consult RD.  Pryor Ochoa RD, LDN Inpatient Clinical Dietitian Pager: [phone] After Hours Pager: [phone]

## 2015-04-05 NOTE — Progress Notes (Signed)
Patient is discharged from room 4N04 at this time. Alert and in stable condition. IV site d/c'd as well as tele. Instructions read to patient and understanding verbalized. Left unit via wheelchair with wife and all belongings.

## 2015-04-09 ENCOUNTER — Inpatient Hospital Stay: Payer: 59 | Admitting: Family Medicine

## 2016-04-28 ENCOUNTER — Ambulatory Visit (INDEPENDENT_AMBULATORY_CARE_PROVIDER_SITE_OTHER): Payer: 59 | Admitting: Family Medicine

## 2016-04-28 ENCOUNTER — Encounter: Payer: Self-pay | Admitting: Family Medicine

## 2016-04-28 VITALS — BP 189/99 | HR 87 | Temp 98.5°F | Ht 64.0 in | Wt 203.4 lb

## 2016-04-28 DIAGNOSIS — R5383 Other fatigue: Secondary | ICD-10-CM | POA: Diagnosis not present

## 2016-04-28 DIAGNOSIS — N529 Male erectile dysfunction, unspecified: Secondary | ICD-10-CM | POA: Insufficient documentation

## 2016-04-28 DIAGNOSIS — I10 Essential (primary) hypertension: Secondary | ICD-10-CM

## 2016-04-28 DIAGNOSIS — E118 Type 2 diabetes mellitus with unspecified complications: Secondary | ICD-10-CM

## 2016-04-28 DIAGNOSIS — Z20828 Contact with and (suspected) exposure to other viral communicable diseases: Secondary | ICD-10-CM

## 2016-04-28 LAB — BASIC METABOLIC PANEL WITH GFR
BUN: 13 mg/dL (ref 7–25)
CO2: 26 mmol/L (ref 20–31)
CREATININE: 1.18 mg/dL (ref 0.70–1.33)
Calcium: 8.9 mg/dL (ref 8.6–10.3)
Chloride: 104 mmol/L (ref 98–110)
GFR, EST AFRICAN AMERICAN: 78 mL/min (ref >=60)
GFR, Est Non African American: 68 mL/min (ref >=60)
Glucose, Bld: 195 mg/dL — ABNORMAL HIGH (ref 65–99)
Potassium: 3.7 mmol/L (ref 3.5–5.3)
Sodium: 139 mmol/L (ref 135–146)

## 2016-04-28 LAB — CBC
HEMATOCRIT: 44.2 % (ref 38.5–50.0)
Hemoglobin: 15.5 g/dL (ref 13.2–17.1)
MCH: 30.8 pg (ref 27.0–33.0)
MCHC: 35.1 g/dL (ref 32.0–36.0)
MCV: 87.7 fL (ref 80.0–100.0)
MPV: 12.1 fL (ref 7.5–12.5)
Platelets: 212 10*3/uL (ref 140–400)
RBC: 5.04 MIL/uL (ref 4.20–5.80)
RDW: 13.7 % (ref 11.0–15.0)
WBC: 6.5 10*3/uL (ref 3.8–10.8)

## 2016-04-28 LAB — TSH: TSH: 0.94 mIU/L (ref 0.40–4.50)

## 2016-04-28 LAB — LDL CHOLESTEROL, DIRECT: Direct LDL: 122 mg/dL (ref <130)

## 2016-04-28 LAB — POCT GLYCOSYLATED HEMOGLOBIN (HGB A1C): Hemoglobin A1C: 6.2

## 2016-04-28 MED ORDER — LISINOPRIL-HYDROCHLOROTHIAZIDE 20-25 MG PO TABS: 0.5000 | ORAL_TABLET | Freq: Every day | ORAL | Status: DC

## 2016-04-28 MED ORDER — METFORMIN HCL ER 500 MG PO TB24: 500.0000 mg | ORAL_TABLET | Freq: Every day | ORAL | Status: DC

## 2016-04-28 MED ORDER — SILDENAFIL CITRATE 100 MG PO TABS: 50.0000 mg | ORAL_TABLET | Freq: Every day | ORAL | Status: AC | PRN

## 2016-04-28 MED ORDER — ASPIRIN EC 81 MG PO TBEC: 81.0000 mg | DELAYED_RELEASE_TABLET | Freq: Every day | ORAL | Status: DC

## 2016-04-28 MED ORDER — ATORVASTATIN CALCIUM 20 MG PO TABS: 20.0000 mg | ORAL_TABLET | Freq: Every day | ORAL | Status: DC

## 2016-04-28 NOTE — Assessment & Plan Note (Signed)
Patient is here with type 2 diabetes. He does not have a PCP and will be establishing care in our clinic. He previously been on glipizide and metformin but has not been taking these medications for multiple months. - Obtaining A1c today. - Initial dosing of metformin today. - Low-dose aspirin - Statin initiation

## 2016-04-28 NOTE — Progress Notes (Signed)
HPI  CC: Establishing care Patient is here to establish care clinic. He states he has not been seen by a primary care provider in many years. He states that he has a previous diagnosis of diabetes type 2, hypertension, and a review his heart murmur. He states he feels overall quite well and has no significant issues at this time. He doesn't door some issues consistent with erectile dysfunction as he states he has a difficult time maintaining an erection during intercourse with his wife. He denies any chest pain, fevers, weight loss, weight gain, headache, blurred vision, chest pain, shortness of breath, nausea, vomiting, diarrhea, weakness, numbness, or paresthesias. Denies dysuria, hematuria, hesitancy, frequency, or weakened urinary stream.  Patient has a previous smoking history. He states that he smoked approximately one pack a day for about 20 years. He quit about 20 years ago but he states that his wife still smokes in the house.  Review of Systems   See HPI for ROS. All other systems reviewed and are negative.  CC, SH/smoking status, and VS noted  Objective: BP 189/99 mmHg  Pulse 87  Temp(Src) 98.5 F (36.9 C) (Oral)  Ht 5\' 4"  (1.626 m)  Wt 203 lb 6.4 oz (92.262 kg)  BMI 34.90 kg/m2 Gen: NAD, alert, cooperative, and pleasant. HEENT: NCAT, EOMI, PERRL, MMM, no LAD, no thyromegaly, poor dentition CV: RRR, systolic murmur noted 2/6 greatest over the right sternal border Resp: CTAB, no wheezes, non-labored Abd: SNTND, BS present, no guarding or organomegaly Ext: No edema, warm Neuro: Alert and oriented, Speech clear, No gross deficits, DTRs +2 bilaterally, sensation intact throughout  Assessment and plan:  Type 2 diabetes mellitus with complication, without long-term current use of insulin ([place]) Patient is here with type 2 diabetes. He does not have a PCP and will be establishing care in our clinic. He previously been on glipizide and metformin but has not been taking these  medications for multiple months. - Obtaining A1c today. - Initial dosing of metformin today. - Low-dose aspirin - Statin initiation  Hypertension Patient has a previous diagnosis of hypertension. Today he is very much hypertensive. I suspect that some of this has to do with anxiety towards establishing care in our clinic today. He states that he had been on medications in the past but has not taken these in many months. - Initiating previous medication, lisinopril/hydrochlorothiazide combo medication. We'll start at low dose and increase after one week.  Erectile dysfunction Patient is endorsing symptoms consistent with erectile dysfunction. He denies any symptoms which would be concerning and would contraindicate sexual activity or use of phosphodiesterase inhibitor. - Prescription for Viagra provided    Orders Placed This Encounter  Procedures  . BASIC METABOLIC PANEL WITH GFR  . CBC  . TSH  . LDL cholesterol, direct  . Hepatitis C antibody  . HIV antibody  . POCT glycosylated hemoglobin (Hb A1C)    Meds ordered this encounter  Medications  . lisinopril-hydrochlorothiazide (PRINZIDE,ZESTORETIC) 20-25 MG tablet    Sig: Take 0.5 tablets by mouth daily.    Dispense:  30 tablet    Refill:  0  . atorvastatin (LIPITOR) 20 MG tablet    Sig: Take 1 tablet (20 mg total) by mouth daily.    Dispense:  90 tablet    Refill:  3  . aspirin EC 81 MG tablet    Sig: Take 1 tablet (81 mg total) by mouth daily.    Dispense:  90 tablet    Refill:  3  . metFORMIN (GLUCOPHAGE XR) 500 MG 24 hr tablet    Sig: Take 1 tablet (500 mg total) by mouth daily with breakfast. Increase the dose to 2 tablets every morning after the first week.    Dispense:  60 tablet    Refill:  1  . sildenafil (VIAGRA) 100 MG tablet    Sig: Take 0.5-1 tablets (50-100 mg total) by mouth daily as needed for erectile dysfunction.    Dispense:  10 tablet    Refill:  0     Elberta Leatherwood, MD,MS,  PGY2 04/28/2016 6:01  PM

## 2016-04-28 NOTE — Assessment & Plan Note (Signed)
Patient is endorsing symptoms consistent with erectile dysfunction. He denies any symptoms which would be concerning and would contraindicate sexual activity or use of phosphodiesterase inhibitor. - Prescription for Viagra provided

## 2016-04-28 NOTE — Patient Instructions (Signed)
It was a pleasure seeing you today in our clinic. Today we discussed your diabetes, high blood pressure, and establishing care. Here is the treatment plan we have discussed and agreed upon together:   - I would like to see back in one month. - I've restarted a handful prescription medications at this time. - Metformin: Take 1 tablet every morning for the first week. Increase this to 2 tablets every morning after that. This medication can sometimes cause diarrhea. This side effect typically improves after 1-2 months of consistent use of this medication. - Lisinopril/hydrochlorothiazide: Take one half tablet every morning for the first week. Increase this to a full tablet after that. If you experience any dizziness, lightheadedness, or feeling faint please discontinue its use and call our office. - Aspirin: Take 1 tablet every day with food. - Atorvastatin: Take 1 tablet every day. This medication can occasionally cause muscle pain. If you notice any muscle soreness in your legs or thighs after starting this medication and there is no good cause for this soreness (like exercise) then please discontinue taking this medication and call our office.

## 2016-04-28 NOTE — Assessment & Plan Note (Signed)
Patient has a previous diagnosis of hypertension. Today he is very much hypertensive. I suspect that some of this has to do with anxiety towards establishing care in our clinic today. He states that he had been on medications in the past but has not taken these in many months. - Initiating previous medication, lisinopril/hydrochlorothiazide combo medication. We'll start at low dose and increase after one week.

## 2016-04-29 LAB — HIV ANTIBODY (ROUTINE TESTING W REFLEX): HIV 1&2 Ab, 4th Generation: NONREACTIVE

## 2016-04-29 LAB — HEPATITIS C ANTIBODY: HCV Ab: NEGATIVE

## 2016-07-16 ENCOUNTER — Other Ambulatory Visit: Payer: Self-pay | Admitting: Family Medicine

## 2016-08-09 ENCOUNTER — Other Ambulatory Visit: Payer: Self-pay | Admitting: Family Medicine

## 2017-08-13 ENCOUNTER — Other Ambulatory Visit: Payer: Self-pay | Admitting: *Deleted

## 2017-08-14 MED ORDER — LISINOPRIL-HYDROCHLOROTHIAZIDE 20-25 MG PO TABS: 0.5000 | ORAL_TABLET | Freq: Every day | ORAL | 5 refills | Status: DC

## 2019-01-02 ENCOUNTER — Encounter: Payer: Self-pay | Admitting: Family Medicine

## 2019-01-02 ENCOUNTER — Ambulatory Visit (HOSPITAL_COMMUNITY)
Admission: RE | Admit: 2019-01-02 | Discharge: 2019-01-02 | Disposition: A | Discharge status: Home / Self Care | Payer: Self-pay | Source: Ambulatory Visit | Attending: Family Medicine | Admitting: Family Medicine

## 2019-01-02 ENCOUNTER — Ambulatory Visit (HOSPITAL_COMMUNITY): Payer: Self-pay

## 2019-01-02 ENCOUNTER — Ambulatory Visit (INDEPENDENT_AMBULATORY_CARE_PROVIDER_SITE_OTHER): Payer: Self-pay | Admitting: Family Medicine

## 2019-01-02 VITALS — BP 152/90 | HR 89 | Temp 98.4°F | Ht 64.0 in | Wt 198.2 lb

## 2019-01-02 DIAGNOSIS — Z0000 Encounter for general adult medical examination without abnormal findings: Secondary | ICD-10-CM

## 2019-01-02 DIAGNOSIS — I10 Essential (primary) hypertension: Secondary | ICD-10-CM | POA: Insufficient documentation

## 2019-01-02 DIAGNOSIS — R002 Palpitations: Secondary | ICD-10-CM

## 2019-01-02 DIAGNOSIS — IMO0001 Reserved for inherently not codable concepts without codable children: Secondary | ICD-10-CM | POA: Insufficient documentation

## 2019-01-02 DIAGNOSIS — E1165 Type 2 diabetes mellitus with hyperglycemia: Secondary | ICD-10-CM

## 2019-01-02 MED ORDER — ATORVASTATIN CALCIUM 40 MG PO TABS: 40.0000 mg | ORAL_TABLET | Freq: Every day | ORAL | 3 refills | Status: DC

## 2019-01-02 MED ORDER — METFORMIN HCL ER 500 MG PO TB24: 1000.0000 mg | ORAL_TABLET | Freq: Every day | ORAL | 3 refills | Status: DC

## 2019-01-02 MED ORDER — LISINOPRIL-HYDROCHLOROTHIAZIDE 20-25 MG PO TABS: 1.0000 | ORAL_TABLET | Freq: Every day | ORAL | 3 refills | Status: DC

## 2019-01-02 NOTE — Assessment & Plan Note (Addendum)
Sporadic.  Asymptomatic.  Does have history of TIA.  No known thyroid issues.  Does have hypertension and diabetes.  EKG NSR at 78 bpm, occasional PVCs which are likely contributing to symptoms, QTc 428, possible left atrial enlargement. - Discussed patient that he could potentially have arrhythmias that are missed but advised to continue daily aspirin for secondary prevention given history of TIA - Reviewed return precautions

## 2019-01-02 NOTE — Progress Notes (Signed)
Subjective   Patient ID: Michael Stephenson    DOB: [birth year], 63 y.o. male   MRN: [id number]  CC: "Physical"  HPI: Akshay Spang is a 63 y.o. male who presents to clinic today for the following:  Annual physical: Patient is here today to follow-up his blood pressure.  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  He is hopeful to receive health insurance through this.  He needs clearance of his safety concerning his elevated blood pressure.  He has not been seen since 2017 and seldomly sees physicians.  He denies fevers or chills, nausea or vomiting, chest pain, shortness of breath, abdominal pain, melena or hematochezia, fatigue or syncope.  Palpitations: He does report occasional sensation of fast heartbeat without other associated symptoms.  These seem to not be triggered by activity.  He does have a history of TIA back in 2016 with a normal EKG and echocardiogram.  His brother has a history of stroke.  She currently takes aspirin every other day.  Hypertension: Patient reports poor adherence to antihypertensives with combo pill HCTZ-lisinopril with half a pill daily.  He takes his medications on average every other day.  He also has known diabetes.  Diabetes: Has been tolerating metformin up until about 2 months ago when he ran out of his medications.  He does not check his sugars and has no history of hypoglycemia or insulin use.  ROS: see HPI for pertinent.  [place]: NIDDM, HTN, TIA, ED. trickle history lap appendectomy.  Family history heart disease, cancer (father, unspecified).  Smoking status reviewed. Medications reviewed.  Objective   BP (!) 152/90   Pulse 89   Temp 98.4 F (36.9 C) (Oral)   Ht 5\' 4"  (1.626 m)   Wt 198 lb 3.2 oz (89.9 kg)   SpO2 96%   BMI 34.02 kg/m  Vitals and nursing note reviewed.  General: well nourished, well developed, NAD with non-toxic appearance HEENT: normocephalic, atraumatic, moist  mucous membranes Neck: supple, non-tender without lymphadenopathy Cardiovascular: regular rate and rhythm with holosytolic murmur, no rubs, or gallops Lungs: clear to auscultation bilaterally with normal work of breathing Abdomen: soft, non-tender, non-distended, normoactive bowel sounds Skin: warm, dry, no rashes or lesions, cap refill < 2 seconds Extremities: warm and well perfused, normal tone, no edema  Assessment & Plan   Palpitations Sporadic.  Asymptomatic.  Does have history of TIA.  No known thyroid issues.  Does have hypertension and diabetes.  EKG NSR at 78 bpm, occasional PVCs which are likely contributing to symptoms, QTc 428, possible left atrial enlargement. - Discussed patient that he could potentially have arrhythmias that are missed but advised to continue daily aspirin for secondary prevention given history of TIA - Reviewed return precautions  Primary hypertension Chronic.  Comorbidities include diabetes.  Poor adherence. - Given refill lisinopril-HCTZ 20-25 mg daily, increase Lipitor to 40 mg daily, and advised to take aspirin 81 mg daily - Discussed importance of adherence  Uncontrolled type 2 diabetes mellitus without complication, without long-term current use of insulin (HCC) Chronic.  Not adherent due to lack of insurance.  Poor follow-up.  Also has known HTN.  No known nephropathy or neuropathy. - Discussed lifestyle modifications, given refill for metformin XR 500 mg with instructions to increase to 1 full tablet twice daily - Checking BMET, will check remaining blood work once patient receives health insurance hopefully within the next 1-3 months - RTC 1 month or sooner  if needed  Orders Placed This Encounter  Procedures  . Basic Metabolic Panel  . EKG 12-Lead   Meds ordered this encounter  Medications  . lisinopril-hydrochlorothiazide (PRINZIDE,ZESTORETIC) 20-25 MG tablet    Sig: Take 1 tablet by mouth daily.    Dispense:  90 tablet    Refill:  3  .  metFORMIN (GLUCOPHAGE-XR) 500 MG 24 hr tablet    Sig: Take 2 tablets (1,000 mg total) by mouth daily with breakfast.    Dispense:  180 tablet    Refill:  3  . atorvastatin (LIPITOR) 40 MG tablet    Sig: Take 1 tablet (40 mg total) by mouth daily.    Dispense:  90 tablet    Refill:  [place], [place], PGY-3 01/02/2019, 3:22 PM

## 2019-01-02 NOTE — Assessment & Plan Note (Addendum)
Chronic.  Not adherent due to lack of insurance.  Poor follow-up.  Also has known HTN.  No known nephropathy or neuropathy. - Discussed lifestyle modifications, given refill for metformin XR 500 mg with instructions to increase to 1 full tablet twice daily - Checking BMET, will check remaining blood work once patient receives health insurance hopefully within the next 1-3 months - RTC 1 month or sooner if needed

## 2019-01-02 NOTE — Assessment & Plan Note (Signed)
Chronic.  Comorbidities include diabetes.  Poor adherence. - Given refill lisinopril-HCTZ 20-25 mg daily, increase Lipitor to 40 mg daily, and advised to take aspirin 81 mg daily - Discussed importance of adherence

## 2019-01-02 NOTE — Patient Instructions (Addendum)
Thank you for coming in to see [place] today. Please see below to review our plan for today's visit.  - Take your metformin 500 mg twice daily - I increased your HCTZ-lisinopril to 1 full tablet daily - Be sure to take your Lipitor daily along with a daily aspirin - We will follow-up with you in 1 month  Please call the clinic at [phone] if your symptoms worsen or you have any concerns. It was our pleasure to serve you.  [place], [place], PGY-3

## 2019-01-03 ENCOUNTER — Encounter: Payer: Self-pay | Admitting: Family Medicine

## 2019-01-03 LAB — BASIC METABOLIC PANEL
BUN/Creatinine Ratio: 11 (ref 10–24)
BUN: 13 mg/dL (ref 8–27)
CHLORIDE: 99 mmol/L (ref 96–106)
CO2: 25 mmol/L (ref 20–29)
CREATININE: 1.14 mg/dL (ref 0.76–1.27)
Calcium: 9.5 mg/dL (ref 8.6–10.2)
GFR calc Af Amer: 80 mL/min/{1.73_m2} (ref >59)
GFR, EST NON AFRICAN AMERICAN: 70 mL/min/{1.73_m2} (ref >59)
Glucose: 239 mg/dL — ABNORMAL HIGH (ref 65–99)
Potassium: 4.2 mmol/L (ref 3.5–5.2)
SODIUM: 139 mmol/L (ref 134–144)

## 2019-03-10 ENCOUNTER — Ambulatory Visit: Payer: Self-pay | Admitting: Family Medicine

## 2019-03-11 ENCOUNTER — Telehealth (INDEPENDENT_AMBULATORY_CARE_PROVIDER_SITE_OTHER): Payer: Self-pay | Admitting: Family Medicine

## 2019-03-11 ENCOUNTER — Other Ambulatory Visit: Payer: Self-pay

## 2019-03-11 DIAGNOSIS — E118 Type 2 diabetes mellitus with unspecified complications: Secondary | ICD-10-CM

## 2019-03-11 DIAGNOSIS — IMO0001 Reserved for inherently not codable concepts without codable children: Secondary | ICD-10-CM

## 2019-03-11 DIAGNOSIS — R200 Anesthesia of skin: Secondary | ICD-10-CM

## 2019-03-11 DIAGNOSIS — R202 Paresthesia of skin: Secondary | ICD-10-CM

## 2019-03-11 DIAGNOSIS — I10 Essential (primary) hypertension: Secondary | ICD-10-CM

## 2019-03-11 DIAGNOSIS — E1165 Type 2 diabetes mellitus with hyperglycemia: Secondary | ICD-10-CM

## 2019-03-11 NOTE — Progress Notes (Signed)
[facility] Visit  Patient consented to have virtual visit. Method of visit: Telephone, attempted video but software outdated.   Encounter participants: Patient: Michael Stephenson - located at Home Provider: Patriciaann Clan - located at [facility]  Others (if applicable): None   Chief Complaint: F/u DM/HTN, toe numbness/burning sensation  HPI: Mr. 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  Feels that he has good support and is handling this transition well.  Is hopeful to return back to this job once they are able to reopen.  Does not have any insurance currently.  Diabetes: Diagnosed in 2016.  Last A1c 6.2 on 04/2016  Taking medications: metformin 1000mg  XR, taking on a daily basis  Glucose average: Does not check glucose.  Hypoglycemic symptoms? No  Exercise: Walking regularly  Diet: "try to be conscious" on what he eats, fruits/veggies/lean meats. Indulges on desserts occasionally.  On Aspirin, and on statin: yes  Last eye exam: Long time ago.  Last foot exam: Been awhile, checks his feet often regularly.  ROS: denies dizziness, diaphoresis, LOC, polyuria, polydipsia  HTN: Taking the lisinopril-HCTZ 20-25 mg at home with good compliance. Not checking blood pressure and does not have a cuff at home.   Numbness left toe/burning sensation: Started about 2 weeks ago, numbness mainly in the left big toe. A week ago, felt burning sensation into the left calf. Numbness is unchanged since onset. Numbness is constant, burning sensation comes and goes. Can feel his toe when he touches it and says it has the same color as his right toes, but sensation feels different in the left.  Not bothering him too much, but worried as he states this is not been a problem in the past. Denies trauma to the area.  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  Denies any redness or swelling in the area, rashes, open wounds. Felt warm recently but no subjective fevers (endorses alittle sore throat, no SOB). Hasn't tried anything to make it better. No specific exacerbating factors, including specific movements or activities. Denies any weakness, specifically in his left ankle/knee joints or anywhere else. No numbness elsewhere. Denies any saddle anesthesia, bowel/bladder incontinence, paresthesias elsewhere, facial drooping, slurring of speech.  No current lower back pains, however said his back was hurting a little bit a few weeks ago.  ROS: per HPI  Pertinent PMHx: Type 2 diabetes, hypertension, TIA  Exam: Via telephone Respiratory: Breathing comfortably, speaking in full sentences Psych: Calm and appropriate  Assessment/Plan:  Numbness and tingling of left lower extremity Specifically left toe numbness with associated neuropathic pain of left calf that is unchanged since onset 2 weeks ago. Unfortunately, unable to fully assess with physical exam due to telephone encounter.  Reassured by the stability and duration of his presentation without any red flags concerning for emergent situations such as cauda equina syndrome.  However, will continue to consider lumbar pathology, mainly following L4/S1 dermatomal regions per report, but would have expected patient to have concurrent back pains. Additionally could consider direct injury, such as from wearing steel toe shoes for work. Unlikely diabetic neuropathy, would be an atypical presentation with unilaterality and distribution. - Lumbar x-ray 2-view, instructed to walk-in at [facility] the next 1-2 days  - Following this,  if continues to be stable, would like patient to follow-up in office in the next week for further evaluation (in addition to needing f/u labs for DM/HTN and HTN monitoring) -  Tylenol as needed as reports it is not too bothersome - Precautions to seek care earlier than follow-up discussed extensively - Discussed case with faculty preceptor  Primary hypertension Restarted lisinopril-HCTZ 20-25 mg in 12/2018. Reports compliance with this, well-tolerated.  Unable to assess if this is controlling his blood pressures to goal, patient does not monitor BP at home.  -Continue home medication - Monitor BP at follow-up, medication management as appropriate - Obtain CMP at follow-up to monitor creatinine  Type 2 diabetes mellitus with complication, without long-term current use of insulin (HCC) Last A1c 6.2 back in 2017. Reports compliance with metformin 1000mg  XR daily, previously had not been taking it when he was seen in 12/2018.  Difficult to know glucose control without recent labs or CBGs, however reassured he does not describe any hypo-/hyperglycemic symptomatology.  Congratulated patient on trying to stay active and following a balanced diet. - Continue metformin 1000 mg XR daily, Lipitor, and aspirin - Referral placed for ophthalmology exam - Recommend obtaining A1c, CMP, microalbumin, lipid panel on f/u in 1 week - Perform foot exam on follow-up  - Additionally consider discussing pneumococcal vaccine   Follow-up in approximately 1 week for conditions discussed above, sooner if needed.  Time spent during visit with patient: 15 minutes  Patriciaann Clan, DO

## 2019-03-12 ENCOUNTER — Ambulatory Visit
Admission: RE | Admit: 2019-03-12 | Discharge: 2019-03-12 | Disposition: A | Discharge status: Home / Self Care | Payer: No Typology Code available for payment source | Source: Ambulatory Visit | Attending: Family Medicine | Admitting: Family Medicine

## 2019-03-12 ENCOUNTER — Other Ambulatory Visit: Payer: Self-pay

## 2019-03-12 DIAGNOSIS — R202 Paresthesia of skin: Secondary | ICD-10-CM

## 2019-03-12 DIAGNOSIS — R200 Anesthesia of skin: Secondary | ICD-10-CM | POA: Insufficient documentation

## 2019-03-12 NOTE — Assessment & Plan Note (Deleted)
Last A1c 6.2 back in 2017. Reports compliance with metformin 1000mg  XR daily, previously had not been taking it when he was seen in 12/2018.  Difficult to know glucose control without recent labs or CBGs, however reassured he does not describe any hypo-/hyperglycemic symptomatology.  Congratulated patient on trying to stay active and following a balanced diet. - Continue metformin 1000 mg XR daily, Lipitor, and aspirin - Referral placed for ophthalmology exam - Recommend obtaining A1c, CMP, microalbumin, lipid panel on f/u in 1 week - Perform foot exam on follow-up  - Additionally consider discussing pneumococcal vaccine

## 2019-03-12 NOTE — Assessment & Plan Note (Addendum)
Specifically left toe numbness with associated neuropathic pain of left calf that is unchanged since onset 2 weeks ago. Unfortunately, unable to fully assess with physical exam due to telephone encounter.  Reassured by the stability and duration of his presentation without any red flags concerning for emergent situations such as cauda equina syndrome.  However, will continue to consider lumbar pathology, mainly following L4/S1 dermatomal regions per report, but would have expected patient to have concurrent back pains. Additionally could consider direct injury, such as from wearing steel toe shoes for work. Unlikely diabetic neuropathy, would be an atypical presentation with unilaterality and distribution. - Lumbar x-ray 2-view, instructed to walk-in at [place] the next 1-2 days  - Following this, if continues to be stable, would like patient to follow-up in office in the next week for further evaluation (in addition to needing f/u labs for DM/HTN and HTN monitoring) - Tylenol as needed as reports it is not too bothersome - Precautions to seek care earlier than follow-up discussed extensively - Discussed case with faculty preceptor

## 2019-03-12 NOTE — Assessment & Plan Note (Signed)
Restarted lisinopril-HCTZ 20-25 mg in 12/2018. Reports compliance with this, well-tolerated.  Unable to assess if this is controlling his blood pressures to goal, patient does not monitor BP at home.  -Continue home medication - Monitor BP at follow-up, medication management as appropriate - Obtain CMP at follow-up to monitor creatinine

## 2019-03-12 NOTE — Assessment & Plan Note (Signed)
Last A1c 6.2 back in 2017. Reports compliance with metformin 1000mg  XR daily, previously had not been taking it when he was seen in 12/2018.  Difficult to know glucose control without recent labs or CBGs, however reassured he does not describe any hypo-/hyperglycemic symptomatology.  Congratulated patient on trying to stay active and following a balanced diet. - Continue metformin 1000 mg XR daily, Lipitor, and aspirin - Referral placed for ophthalmology exam - Recommend obtaining A1c, CMP, microalbumin, lipid panel on f/u in 1 week - Perform foot exam on follow-up  - Additionally consider discussing pneumococcal vaccine

## 2019-03-13 ENCOUNTER — Telehealth: Payer: Self-pay | Admitting: *Deleted

## 2019-03-13 NOTE — Telephone Encounter (Signed)
-----   Message from Patriciaann Clan, DO sent at 03/12/2019  6:01 PM EDT ----- Please let patient know his lumbar x-ray is normal. Some evidence of mild hip joint changes that come with age, but otherwise not concerning. Thank you!

## 2019-03-13 NOTE — Telephone Encounter (Signed)
Pt called nurse line returning call. I informed him of normal xray results.

## 2019-03-13 NOTE — Telephone Encounter (Signed)
LMOVM for to call us back. Wanted to let him know that his x-ray came back normal. Delray Alt, CMA

## 2019-03-21 ENCOUNTER — Encounter: Payer: Self-pay | Admitting: Family Medicine

## 2019-03-21 ENCOUNTER — Other Ambulatory Visit: Payer: Self-pay

## 2019-03-21 ENCOUNTER — Ambulatory Visit: Payer: Self-pay | Admitting: Family Medicine

## 2019-03-21 ENCOUNTER — Ambulatory Visit (INDEPENDENT_AMBULATORY_CARE_PROVIDER_SITE_OTHER): Payer: Self-pay | Admitting: Family Medicine

## 2019-03-21 ENCOUNTER — Ambulatory Visit: Payer: Self-pay

## 2019-03-21 VITALS — BP 134/62 | HR 81

## 2019-03-21 DIAGNOSIS — R6882 Decreased libido: Secondary | ICD-10-CM

## 2019-03-21 DIAGNOSIS — E118 Type 2 diabetes mellitus with unspecified complications: Secondary | ICD-10-CM

## 2019-03-21 LAB — POCT GLYCOSYLATED HEMOGLOBIN (HGB A1C): HbA1c, POC (controlled diabetic range): 8.4 % — AB (ref 0.0–7.0)

## 2019-03-21 MED ORDER — METFORMIN HCL 1000 MG PO TABS: 1000.0000 mg | ORAL_TABLET | Freq: Two times a day (BID) | ORAL | 2 refills | Status: AC

## 2019-03-21 MED ORDER — TADALAFIL 20 MG PO TABS: 10.0000 mg | ORAL_TABLET | ORAL | 11 refills | Status: AC | PRN

## 2019-03-21 NOTE — Progress Notes (Signed)
Subjective: Chief Complaint  Patient presents with  . Diabetes     HPI: Michael Stephenson is a 63 y.o. presenting to clinic today to discuss the following:  DM Follow Up A1c elevated today at 8.4 from 6.2 previously. Discussed diet and exercise as a way to help get blood glucose back under good control. He does have some neuropathy symptoms starting to develop as well with report burning in his left great toe with numbness. We discussed this is likely from his uncontrolled diabetes and stressed the need to get and keep better control. Patient agreed. He also admitted to eating more carbs such as breads lately.  Low sex drive Patient states he has a low sex drive and difficulty initiating sex with his wife. He also states he has difficulty obtaining and maintaining an erection. He takes Cialis for his ED symptoms but wonders if his low sex drive may be due to something else.  ROS noted in HPI.   Past Medical, Surgical, Social, and Family History Reviewed & Updated per EMR.   Pertinent Historical Findings include:   Social History   Tobacco Use  Smoking Status Former Smoker  Smokeless Tobacco Former Systems developer  . Quit date: 04/16/1992      Objective: BP 134/62   Pulse 81   SpO2 97%  Vitals and nursing notes reviewed  Physical Exam Gen: Alert and Oriented x 3, NAD HEENT: Normocephalic, atraumatic Neck: no LAD CV: RRR, no murmurs, normal S1, S2 split Resp: CTAB, no wheezing, rales, or rhonchi, comfortable work of breathing Abd: non-distended, non-tender, soft, +bs in all four quadrants Ext: no clubbing, cyanosis, or edema Skin: warm, dry, intact, no rashes  Results for orders placed or performed in visit on 03/21/19 (from the past 72 hour(s))  POCT glycosylated hemoglobin (Hb A1C)     Status: Abnormal   Collection Time: 03/21/19  3:47 PM  Result Value Ref Range   Hemoglobin A1C     HbA1c POC (<> result, manual entry)     HbA1c, POC (prediabetic range)     HbA1c, POC  (controlled diabetic range) 8.4 (A) 0.0 - 7.0 %    Assessment/Plan:  Type 2 diabetes mellitus with complication, without long-term current use of insulin (HCC) Increased Metformin to 2g daily. Patient will attempt diet modification with exercise as well to prevent from adding on additional medications. - In 3 months if he is not to goal consider adding Jardiance or Canaglifozin as additional oral agent. - If neuropathy worsens could consider low dose of Gabapentin such as 100mg  daily  Libido, decreased From patient history he has ED but this does not explain his lack of sex drive.  - Obtain Testosterone, free and total. If low, bring patient back for repeat testosterone (total and free) to be done in am with patient fasting and if still low consider treatment. This should be a discussion with patient as data on testosterone replacement benefits are mixed. - Refilled Cialis   PATIENT EDUCATION PROVIDED: See AVS    Diagnosis and plan along with any newly prescribed medication(s) were discussed in detail with this patient today. The patient verbalized understanding and agreed with the plan. Patient advised if symptoms worsen return to clinic or ER.    Orders Placed This Encounter  Procedures  . POCT glycosylated hemoglobin (Hb A1C)    Meds ordered this encounter  Medications  . metFORMIN (GLUCOPHAGE) 1000 MG tablet    Sig: Take 1 tablet (1,000 mg total) by mouth 2 (  two) times daily with a meal.    Dispense:  60 tablet    Refill:  2  . tadalafil (ADCIRCA/CIALIS) 20 MG tablet    Sig: Take 0.5-1 tablets (10-20 mg total) by mouth every other day as needed for erectile dysfunction.    Dispense:  5 tablet    Refill:  [place], DO 03/21/2019, 3:57 PM PGY-2 [facility]

## 2019-03-21 NOTE — Patient Instructions (Signed)
It was great to meet you today! Thank you for letting me participate in your care!  Today, we discussed your type 2 diabetes and that your A1c is above the goal of 7.0%. To get you back to goal we are going to do several things. Once you have taken all the old metformin pills please start using the new prescription. Also, cut back on the breads and sweets. Please exercise 5 times per week for 30 minutes per day. The exercise should be moderate intensity and should increase your heart rate.  I will follow up with you on your lab results regarding your testosterone levels.  Be well, Harolyn Rutherford, DO PGY-2, [facility]

## 2019-03-22 LAB — TESTOSTERONE, FREE, TOTAL, SHBG
Sex Hormone Binding: 29.3 nmol/L (ref 19.3–76.4)
Testosterone, Free: 4.5 pg/mL — ABNORMAL LOW (ref 6.6–18.1)
Testosterone: 99 ng/dL — ABNORMAL LOW (ref 264–916)

## 2019-03-27 DIAGNOSIS — R6882 Decreased libido: Secondary | ICD-10-CM | POA: Insufficient documentation

## 2019-03-27 NOTE — Assessment & Plan Note (Signed)
Increased Metformin to 2g daily. Patient will attempt diet modification with exercise as well to prevent from adding on additional medications. - In 3 months if he is not to goal consider adding Jardiance or Canaglifozin as additional oral agent. - If neuropathy worsens could consider low dose of Gabapentin such as 100mg  daily

## 2019-03-27 NOTE — Assessment & Plan Note (Addendum)
From patient history he has ED but this does not explain his lack of sex drive.  - Obtain Testosterone, free and total. If low, bring patient back for repeat testosterone (total and free) to be done in am with patient fasting and if still low consider treatment. This should be a discussion with patient as data on testosterone replacement benefits are mixed. - Refilled Cialis

## 2019-07-09 ENCOUNTER — Encounter (HOSPITAL_COMMUNITY): Payer: Self-pay | Admitting: Emergency Medicine

## 2019-07-09 ENCOUNTER — Other Ambulatory Visit: Payer: Self-pay

## 2019-07-09 ENCOUNTER — Ambulatory Visit (HOSPITAL_COMMUNITY)
Admission: EM | Admit: 2019-07-09 | Discharge: 2019-07-09 | Disposition: A | Discharge status: Home / Self Care | Payer: Self-pay | Attending: Family Medicine | Admitting: Family Medicine

## 2019-07-09 DIAGNOSIS — M545 Low back pain, unspecified: Secondary | ICD-10-CM

## 2019-07-09 DIAGNOSIS — Z20828 Contact with and (suspected) exposure to other viral communicable diseases: Secondary | ICD-10-CM | POA: Insufficient documentation

## 2019-07-09 DIAGNOSIS — E118 Type 2 diabetes mellitus with unspecified complications: Secondary | ICD-10-CM | POA: Insufficient documentation

## 2019-07-09 DIAGNOSIS — I10 Essential (primary) hypertension: Secondary | ICD-10-CM | POA: Insufficient documentation

## 2019-07-09 DIAGNOSIS — Z87891 Personal history of nicotine dependence: Secondary | ICD-10-CM | POA: Insufficient documentation

## 2019-07-09 DIAGNOSIS — Z7984 Long term (current) use of oral hypoglycemic drugs: Secondary | ICD-10-CM | POA: Insufficient documentation

## 2019-07-09 DIAGNOSIS — Z809 Family history of malignant neoplasm, unspecified: Secondary | ICD-10-CM | POA: Insufficient documentation

## 2019-07-09 DIAGNOSIS — Z79899 Other long term (current) drug therapy: Secondary | ICD-10-CM | POA: Insufficient documentation

## 2019-07-09 DIAGNOSIS — Z8249 Family history of ischemic heart disease and other diseases of the circulatory system: Secondary | ICD-10-CM | POA: Insufficient documentation

## 2019-07-09 DIAGNOSIS — N529 Male erectile dysfunction, unspecified: Secondary | ICD-10-CM | POA: Insufficient documentation

## 2019-07-09 DIAGNOSIS — Z8673 Personal history of transient ischemic attack (TIA), and cerebral infarction without residual deficits: Secondary | ICD-10-CM | POA: Insufficient documentation

## 2019-07-09 DIAGNOSIS — Z7952 Long term (current) use of systemic steroids: Secondary | ICD-10-CM | POA: Insufficient documentation

## 2019-07-09 MED ORDER — PREDNISONE 10 MG (21) PO TBPK: ORAL_TABLET | ORAL | 0 refills | Status: DC

## 2019-07-09 NOTE — ED Triage Notes (Signed)
Pan in back and legs.  Onset of pain was 2 weeks ago.  Initially pain in right hip, then left hip

## 2019-07-09 NOTE — ED Provider Notes (Signed)
[place]    CSN: [id number] Arrival date & time: 07/09/19  1226      History   Chief Complaint Chief Complaint  Patient presents with  . Back Pain    HPI Michael Stephenson is a 63 y.o. male.   Patient is a 63 year old male with past medical history of hypertension, diabetes, paresthesias, TIA.  He presents today with approximately 2 weeks of lower back pain with radiation down both hips and legs.  Symptoms have been constant, waxing and waning.  Denies any associated numbness or tingling.  He has been taking Aleve without much relief of his symptoms.  Denies any dysuria, hematuria or urinary frequency.  Denies any bowel or bladder issues.  Denies any saddle paresthesias.  No falls or injuries.  He also had diarrhea x 1 day, severe that presolved, mild cough and hot flashes. Did not check temperature.  ROS per HPI      Past Medical History:  Diagnosis Date  . Heart murmur   . Hypertension     Patient Active Problem List   Diagnosis Date Noted  . Libido, decreased 03/27/2019  . Numbness and tingling of left lower extremity 03/12/2019  . Uncontrolled type 2 diabetes mellitus without complication, without long-term current use of insulin ([place]) 01/02/2019  . Primary hypertension 01/02/2019  . Palpitations 01/02/2019  . Encounter for annual physical exam 01/02/2019  . Type 2 diabetes mellitus with complication, without long-term current use of insulin ([place]) 04/28/2016  . Erectile dysfunction 04/28/2016  . Other specified transient cerebral ischemias   . Paresthesias   . Diabetes mellitus, new onset ([place]) 04/03/2015  . TIA (transient ischemic attack) 04/03/2015  . Postop check 04/16/2012  . Hypertension 04/16/2012    Past Surgical History:  Procedure Laterality Date  . APPENDECTOMY  03/30/2012  . LAPAROSCOPIC APPENDECTOMY  03/30/2012   Procedure: APPENDECTOMY LAPAROSCOPIC;  Surgeon: Gwenyth Ober, MD;  Location: [facility];  Service: General;  Laterality: N/A;       Home Medications    Prior to Admission medications   Medication Sig Start Date End Date Taking? Authorizing Provider  blood glucose meter kit and supplies Dispense based on patient and insurance preference. Use up to four times daily as directed. (FOR ICD-9 250.00, 250.01). Patient not taking: Reported on 01/02/2019 04/05/15   Regalado, Jerald Kief A, MD  lisinopril-hydrochlorothiazide (PRINZIDE,ZESTORETIC) 20-25 MG tablet Take 1 tablet by mouth daily. 01/02/19   [place] Bing, DO  metFORMIN (GLUCOPHAGE) 1000 MG tablet Take 1 tablet (1,000 mg total) by mouth 2 (two) times daily with a meal. 03/21/19   Lockamy, Timothy, DO  predniSONE (STERAPRED UNI-PAK 21 TAB) 10 MG (21) TBPK tablet 6 tabs for 1 day, then 5 tabs for 1 das, then 4 tabs for 1 day, then 3 tabs for 1 day, 2 tabs for 1 day, then 1 tab for 1 day 07/09/19   Loura Halt A, NP  sildenafil (VIAGRA) 100 MG tablet Take 0.5-1 tablets (50-100 mg total) by mouth daily as needed for erectile dysfunction. 04/28/16   McKeag, Marylynn Pearson, MD  tadalafil (ADCIRCA/CIALIS) 20 MG tablet Take 0.5-1 tablets (10-20 mg total) by mouth every other day as needed for erectile dysfunction. 03/21/19   Nuala Alpha, DO  atorvastatin (LIPITOR) 40 MG tablet Take 1 tablet (40 mg total) by mouth daily. 01/02/19 07/09/19  [place] Bing, DO    Family History Family History  Problem Relation Age of Onset  . Cancer Father   . Heart disease Mother  Social History Social History   Tobacco Use  . Smoking status: Former Research scientist (life sciences)  . Smokeless tobacco: Former Systems developer    Quit date: 04/16/1992  Substance Use Topics  . Alcohol use: No  . Drug use: No     Allergies   Patient has no known allergies.   Review of Systems Review of Systems   Physical Exam Triage Vital Signs ED Triage Vitals  Enc Vitals Group     BP 07/09/19 1258 132/86     Pulse Rate 07/09/19 1258 85     Resp 07/09/19 1258 18     Temp 07/09/19 1258 98.5 F (36.9 C)     Temp Source 07/09/19 1258  Oral     SpO2 07/09/19 1258 97 %     Weight --      Height --      Head Circumference --      Peak Flow --      Pain Score 07/09/19 1254 10     Pain Loc --      Pain Edu? --      Excl. in [place]? --    No data found.  Updated Vital Signs BP 132/86 (BP Location: Right Arm)   Pulse 85   Temp 98.5 F (36.9 C) (Oral)   Resp 18   SpO2 97%   Visual Acuity Right Eye Distance:   Left Eye Distance:   Bilateral Distance:    Right Eye Near:   Left Eye Near:    Bilateral Near:     Physical Exam Vitals signs and nursing note reviewed.  Constitutional:      Appearance: Normal appearance.  HENT:     Head: Normocephalic and atraumatic.     Nose: Nose normal.  Eyes:     Conjunctiva/sclera: Conjunctivae normal.  Neck:     Musculoskeletal: Normal range of motion.  Pulmonary:     Effort: Pulmonary effort is normal.  Musculoskeletal: Normal range of motion.        General: No swelling or tenderness.     Comments: Bilateral positive straight leg raise.   Skin:    General: Skin is warm and dry.     Findings: No rash.  Neurological:     General: No focal deficit present.     Mental Status: He is alert.     Sensory: No sensory deficit.     Motor: No weakness.     Coordination: Coordination normal.     Gait: Gait normal.  Psychiatric:        Mood and Affect: Mood normal.      UC Treatments / Results  Labs (all labs ordered are listed, but only abnormal results are displayed) Labs Reviewed  NOVEL CORONAVIRUS, NAA (HOSPITAL ORDER, SEND-OUT TO REF LAB)    EKG   Radiology No results found.  Procedures Procedures (including critical care time)  Medications Ordered in UC Medications - No data to display  Initial Impression / Assessment and Plan / UC Course  I have reviewed the triage vital signs and the nursing notes.  Pertinent labs & imaging results that were available during my care of the patient were reviewed by me and considered in my medical decision making (see  chart for details).     Treating for lower back pain with sciatic nerve involvement.  Prednisone taper. Instructed to monitor blood sugars.  Also tested for COVID based on other symptoms. Labs pending  Follow up as needed for continued or worsening symptoms   Final  Clinical Impressions(s) / UC Diagnoses   Final diagnoses:  Acute bilateral low back pain, unspecified whether sciatica present     Discharge Instructions     Treating you with steroid taper for the back pain and sciatic nerve inflammation.  Do not take any Aleve while taking this medication. Be aware this may make you blood sugars increase. Make sure that you are drinking plenty of fluids and watching your sugar intake.  We are also testing you for COVID based on your other symptoms. We will call if your results are positive.  If your problem continues you need to follow-up with your primary care doctor or the [place]    ED Prescriptions    Medication Sig Dispense Auth. Provider   predniSONE (STERAPRED UNI-PAK 21 TAB) 10 MG (21) TBPK tablet 6 tabs for 1 day, then 5 tabs for 1 das, then 4 tabs for 1 day, then 3 tabs for 1 day, 2 tabs for 1 day, then 1 tab for 1 day 21 tablet Loura Halt A, NP     Controlled Substance Prescriptions [place] Controlled Substance Registry consulted? no   Orvan July, NP 07/10/19 1557

## 2019-07-09 NOTE — Discharge Instructions (Signed)
Treating you with steroid taper for the back pain and sciatic nerve inflammation.  Do not take any Aleve while taking this medication. Be aware this may make you blood sugars increase. Make sure that you are drinking plenty of fluids and watching your sugar intake.  We are also testing you for COVID based on your other symptoms. We will call if your results are positive.  If your problem continues you need to follow-up with your primary care doctor or the [place]

## 2019-07-11 LAB — NOVEL CORONAVIRUS, NAA (HOSP ORDER, SEND-OUT TO REF LAB; TAT 18-24 HRS): SARS-CoV-2, NAA: NOT DETECTED

## 2019-09-05 ENCOUNTER — Other Ambulatory Visit: Payer: Self-pay

## 2019-09-05 ENCOUNTER — Ambulatory Visit (INDEPENDENT_AMBULATORY_CARE_PROVIDER_SITE_OTHER): Payer: Self-pay | Admitting: Family Medicine

## 2019-09-05 ENCOUNTER — Encounter: Payer: Self-pay | Admitting: Family Medicine

## 2019-09-05 VITALS — BP 128/64 | HR 95 | Wt 184.0 lb

## 2019-09-05 DIAGNOSIS — E118 Type 2 diabetes mellitus with unspecified complications: Secondary | ICD-10-CM

## 2019-09-05 DIAGNOSIS — M25559 Pain in unspecified hip: Secondary | ICD-10-CM

## 2019-09-05 DIAGNOSIS — R202 Paresthesia of skin: Secondary | ICD-10-CM

## 2019-09-05 DIAGNOSIS — I10 Essential (primary) hypertension: Secondary | ICD-10-CM

## 2019-09-05 DIAGNOSIS — K921 Melena: Secondary | ICD-10-CM

## 2019-09-05 LAB — POCT GLYCOSYLATED HEMOGLOBIN (HGB A1C): HbA1c, POC (controlled diabetic range): 7.3 % — AB (ref 0.0–7.0)

## 2019-09-05 MED ORDER — GABAPENTIN 100 MG PO CAPS: 100.0000 mg | ORAL_CAPSULE | Freq: Three times a day (TID) | ORAL | 3 refills | Status: AC

## 2019-09-05 NOTE — Assessment & Plan Note (Addendum)
Not well controlled. Last A1c was 03/21/2019 8.4. -Obtain A1c, lipid panel -Continue Metformin 1000mg  BID -Follow up in 1 month for follow up office visit and we will discuss diabetes management

## 2019-09-05 NOTE — Assessment & Plan Note (Addendum)
Chronic. Continuosly intermittenly for 6 months lower extremeities bilaterally . Lumbar imaging 02/2019 without bony abnormalities. Have tried medicinal and non medicinal modalities for relief. It will be worth talking about better diabetes control at our subsequent visit. For now another medicinal agent and physical therapy may be helpful. -Start gabapentin 100mg  TID -Referral to PT -Follow up in 1 month

## 2019-09-05 NOTE — Assessment & Plan Note (Signed)
Controlled.  -Continue Lisinopril-HCTZ 20-25mg  -128/64 -Follow up PRN

## 2019-09-05 NOTE — Patient Instructions (Addendum)
It was very nice to meet you today. Please enjoy the rest of your week. Today you were seen for hip pain and black stool and diarrhea. We referred you to physical therapy for your hip pain and to [facility] for the black stools. They will call you to make an appointment. I have prescribed gabapentin 100mg  3 times daily for the tingling and numbness in your legs. Follow up in 1 month for a follow up visit or sooner if needed.   Please call the clinic at [phone] if your symptoms worsen or you have any concerns. It was our pleasure to serve you.

## 2019-09-05 NOTE — Assessment & Plan Note (Signed)
Chronic. Was in April 2020. Lumbar imaging without bony pathology. Has tried heat, muscle rubs, and NSAIDS without relief. -Referral to PT -Follow up PRN

## 2019-09-05 NOTE — Progress Notes (Signed)
Subjective:    Patient ID: Michael Stephenson, male    DOB: [birth year], 63 y.o.   MRN: [id number]   CC: Joint pain, dark stool  HPI: [name] is a 70 yo african [place] male that is presenting today due to chronic joint pain of several months and dark stool 1 month ago.   He was seen at urgent care 2 month ago for back pain and was treated with steroids. Today he concerned about his bilateral hip pain that continues with movement, hot baths, muscles rubs, and NSAIDS. This pain radiates into both legs and is burning and tingling in nature and occasionally keeps him up at night.   His other concern today is a dark stool that occurred 1 month ago. He has also had a 20 lb weight loss since December 2019. Other associated symptoms are lost of taste after recent sore throat in which he tested negative for COVID 07/09/2019, diarrhea intermittently which he attributes to taking metformin, as well as dizziness when attempting to perform yard work. He does not have a family hx of colon cancer or a hx of abdominal surgeries other than a remote appendectomy. He has never had a colonoscopy.   Smoking status reviewed  Review of Systems Per HPI, also denies recent illness, fever, headache, changes in vision, chest pain, shortness of breath, abdominal pain, N/V, weakness   Patient Active Problem List   Diagnosis Date Noted  . Black stool 09/05/2019  . Hip pain 09/05/2019  . Libido, decreased 03/27/2019  . Numbness and tingling of left lower extremity 03/12/2019  . Uncontrolled type 2 diabetes mellitus without complication, without long-term current use of insulin 01/02/2019  . Primary hypertension 01/02/2019  . Palpitations 01/02/2019  . Encounter for annual physical exam 01/02/2019  . Type 2 diabetes mellitus with complication, without long-term current use of insulin ([place]) 04/28/2016  . Erectile dysfunction 04/28/2016  . Other specified transient cerebral ischemias   . Paresthesias   . Diabetes  mellitus, new onset ([place]) 04/03/2015  . TIA (transient ischemic attack) 04/03/2015  . Postop check 04/16/2012  . Hypertension 04/16/2012     Objective:  BP 128/64   Pulse 95   Wt 184 lb (83.5 kg)   SpO2 98%   BMI 31.58 kg/m  Vitals and nursing note reviewed  General: NAD, pleasant Cardiac: RRR, normal heart sounds, no murmurs Respiratory: CTAB, normal effort Abdomen: soft, nontender, nondistended Extremities: no edema or cyanosis. WWP. Skin: warm and dry, no rashes noted Neuro: alert and oriented, no focal deficits Psych: normal affect  Assessment & Plan:    Hypertension Controlled.  -Continue Lisinopril-HCTZ 20-25mg  -128/64 -Follow up PRN  Type 2 diabetes mellitus with complication, without long-term current use of insulin ([facility]) Not well controlled. Last A1c was 03/21/2019 8.4. -Obtain A1c, lipid panel -Continue Metformin 1000mg  BID -Follow up in 1 month for follow up office visit and we will discuss diabetes management  Paresthesias Chronic. Continuosly intermittenly for 6 months lower extremeities bilaterally . Lumbar imaging 02/2019 without bony abnormalities. Have tried medicinal and non medicinal modalities for relief. It will be worth talking about better diabetes control at our subsequent visit. For now another medicinal agent and physical therapy may be helpful. -Start gabapentin 100mg  TID -Referral to PT -Follow up in 1 month   Black stool 1 month ago, once. Not on going currently, but a work up with symptoms of weight loss, lightheadedness and continued diarrhea is necessary. Although lightheadedness could be due to ED medications I am  more caution because he has never had a colonoscopy. -Referral to [facility]  -Obtain CBC, CMP -Follow up in 1 month, PRN  Hip pain Chronic. Was in April 2020. Lumbar imaging without bony pathology. Has tried heat, muscle rubs, and NSAIDS without relief. -Referral to PT -Follow up PRN   Gerlene Fee, [place] PGY-1

## 2019-09-05 NOTE — Assessment & Plan Note (Addendum)
1 month ago, once. Not on going currently, but a work up with symptoms of weight loss, lightheadedness and continued diarrhea is necessary. Although lightheadedness could be due to ED medications I am more caution because he has never had a colonoscopy. -Referral to [facility]  -Obtain CBC, CMP -Follow up in 1 month, PRN

## 2019-09-06 LAB — COMPREHENSIVE METABOLIC PANEL
ALT: 9 IU/L (ref 0–44)
AST: 36 IU/L (ref 0–40)
Albumin/Globulin Ratio: 1.6 (ref 1.2–2.2)
Albumin: 4.1 g/dL (ref 3.8–4.8)
Alkaline Phosphatase: 2484 IU/L (ref 39–117)
BUN/Creatinine Ratio: 18 (ref 10–24)
BUN: 28 mg/dL — ABNORMAL HIGH (ref 8–27)
Bilirubin Total: 0.6 mg/dL (ref 0.0–1.2)
CO2: 24 mmol/L (ref 20–29)
Calcium: 8.7 mg/dL (ref 8.6–10.2)
Chloride: 93 mmol/L — ABNORMAL LOW (ref 96–106)
Creatinine, Ser: 1.59 mg/dL — ABNORMAL HIGH (ref 0.76–1.27)
GFR calc Af Amer: 53 mL/min/{1.73_m2} — ABNORMAL LOW (ref >59)
GFR calc non Af Amer: 46 mL/min/{1.73_m2} — ABNORMAL LOW (ref >59)
Globulin, Total: 2.6 g/dL (ref 1.5–4.5)
Glucose: 323 mg/dL — ABNORMAL HIGH (ref 65–99)
Potassium: 3.5 mmol/L (ref 3.5–5.2)
Sodium: 132 mmol/L — ABNORMAL LOW (ref 134–144)
Total Protein: 6.7 g/dL (ref 6.0–8.5)

## 2019-09-06 LAB — CBC
Hematocrit: 37.7 % (ref 37.5–51.0)
Hemoglobin: 13 g/dL (ref 13.0–17.7)
MCH: 29.9 pg (ref 26.6–33.0)
MCHC: 34.5 g/dL (ref 31.5–35.7)
MCV: 87 fL (ref 79–97)
Platelets: 319 10*3/uL (ref 150–450)
RBC: 4.35 x10E6/uL (ref 4.14–5.80)
RDW: 13.1 % (ref 11.6–15.4)
WBC: 7 10*3/uL (ref 3.4–10.8)

## 2019-09-06 LAB — LIPID PANEL
Chol/HDL Ratio: 5.5 ratio — ABNORMAL HIGH (ref 0.0–5.0)
Cholesterol, Total: 203 mg/dL — ABNORMAL HIGH (ref 100–199)
HDL: 37 mg/dL — ABNORMAL LOW (ref >39)
LDL Chol Calc (NIH): 103 mg/dL — ABNORMAL HIGH (ref 0–99)
Triglycerides: 372 mg/dL — ABNORMAL HIGH (ref 0–149)
VLDL Cholesterol Cal: 63 mg/dL — ABNORMAL HIGH (ref 5–40)

## 2019-09-07 ENCOUNTER — Telehealth: Payer: Self-pay | Admitting: Family Medicine

## 2019-09-07 NOTE — Telephone Encounter (Signed)
Attempted to call patient when I received below lab results. Left generic voicemail to call.  Patient needs to hold lisinopril/HCTZ as has AKI. Will also need to reduce dose of metformin.  Alkaline phosphatase markedly elevated. Will need repeat ALP, GGT, and further evaluation for marked abnormalities.   Will need to have patient check BG--- A1C does not match most recent glucose values, query if hemoglobinopathy or renal dysfunction contributing.   Please schedule appointment to be seen early this week with Dr. Janus Molder or ATC.   Dorris Singh, MD  Family Medicine Teaching Service

## 2019-09-07 NOTE — Progress Notes (Signed)
I have reviewed all the lab results. There are some abnormalities that may critical to the patient's health, but I would like to discuss these in person at an office appointment. Please ask him to schedule an office visit with me at his earliest convenience just to discuss results.

## 2019-09-08 ENCOUNTER — Telehealth: Payer: Self-pay | Admitting: *Deleted

## 2019-09-08 NOTE — Telephone Encounter (Signed)
-----   Message from [facility], DO sent at 09/07/2019  5:14 PM EDT ----- I have reviewed all the lab results. There are some abnormalities that may critical to the patient's health, but I would like to discuss these in person at an office appointment. Please ask him to schedule an office visit with me at his earliest convenience just to discuss results.

## 2019-09-08 NOTE — Telephone Encounter (Signed)
LMOVM for to call us to schedule an appt to follow up on his lab work.  Kennon Holter, CMA

## 2019-09-08 NOTE — Telephone Encounter (Signed)
Unable to get in contact with pt. Will tray again tomorrow. [place] Kennon Holter, CMA

## 2019-09-10 ENCOUNTER — Encounter: Payer: Self-pay | Admitting: Family Medicine

## 2019-09-10 ENCOUNTER — Telehealth: Payer: Self-pay | Admitting: Family Medicine

## 2019-09-10 NOTE — Telephone Encounter (Signed)
Left generic voicemail for patient's wife, listed in chart, to have patient give [place] a call back.  Dr. Cora Collum, please send the patient a certified letter to call our office. Let me know if you have questions about this.  Thank you, Dorris Singh, MD  [facility] Medicine Teaching Service

## 2019-09-12 ENCOUNTER — Encounter: Payer: Self-pay | Admitting: Gastroenterology

## 2019-09-12 ENCOUNTER — Telehealth: Payer: Self-pay

## 2019-09-12 NOTE — Telephone Encounter (Signed)
Called patient to discuss results.  Reviewed that he has a markedly elevated alkaline phosphatase and acute kidney injury.  His blood glucose is also elevated.  This is discordant with his A1c.  I discussed that I am uncertain of the cause of his elevated alkaline phosphatase at this time and recommend further lab assessment and evaluation.  He reports overall he is feeling okay.  He has not been eating well for about 6 months.  He has had bilateral back and lower extremity pain for about 4 months.  He denies any further episodes of dark tarry stools.  He denies chest pain, nausea, vomiting, dyspnea.  I reviewed reasons to present to the emergency room including uncontrolled pain or worsening of his symptoms or inability to tolerate solids or fluids.  He says he is feeling okay and would like to follow-up next week sometime.  Scheduled appointment at patient's earliest convenience.  At follow-up recommend repeating hepatic function, along with GGT, repeat metabolic panel and INR and consideration of advanced imaging of the lumbar spine given his symptoms.  Would also consider PSA the patient will need a colonoscopy for further evaluation  During the conversation we discussed his medications.  Given his AKI have stopped lisinopril HCTZ combination.  His metformin will likely need dose reduced.  He may need insulin in the future as well as glucose monitoring.  All questions were answered.  I discussed my differential with the patient including that he may have liver disease, he has not had alcohol in about 15 years or potentially malignancy.  He voiced understanding of this he has noted some weight loss and increasing fatigue and was concerned about this.  Offered supportive listening.  Reviewed reasons to call and return to care again.  Dorris Singh, MD  Family Medicine Teaching Service

## 2019-09-12 NOTE — Telephone Encounter (Signed)
Michael Stephenson I think this may have been a mistake.  I did not call this patient nor have I received a consult for him. In reviewing the chart it Looks like Dr. Owens Shark tried to contact him.  Neoma Laming

## 2019-09-12 NOTE — Telephone Encounter (Signed)
Pt LVM on nurse line returning a call to [employer]. No call back number left. Ottis Stain, CMA

## 2019-09-12 NOTE — Telephone Encounter (Signed)
Thank you Michael Stephenson. Dr. Owens Shark did you call this patient? Ottis Stain, CMA

## 2019-09-16 ENCOUNTER — Other Ambulatory Visit: Payer: Self-pay

## 2019-09-16 ENCOUNTER — Ambulatory Visit (INDEPENDENT_AMBULATORY_CARE_PROVIDER_SITE_OTHER): Payer: Self-pay | Admitting: Family Medicine

## 2019-09-16 VITALS — BP 138/86 | HR 78 | Wt 182.8 lb

## 2019-09-16 DIAGNOSIS — M25559 Pain in unspecified hip: Secondary | ICD-10-CM

## 2019-09-16 DIAGNOSIS — I10 Essential (primary) hypertension: Secondary | ICD-10-CM

## 2019-09-16 DIAGNOSIS — R748 Abnormal levels of other serum enzymes: Secondary | ICD-10-CM

## 2019-09-16 NOTE — Patient Instructions (Signed)
It was wonderful to meet you today.  We are going to repeat labs today I will let you know these results in the next few days.  Additionally we are going to get some x-rays of your hip and lower back to see if there is any bony changes causing your pains.  Please be on the look out for physical therapy call.  To help with this pain you can take Tylenol 650 mg every 4-6 hours.  If you develop sudden onset weakness, incontinence of urine or stool, numbness in your groin--please go to the ED immediately.  I would like you to follow-up with your primary care provider, Dr. Feliberto Harts, in about 1-2 weeks.

## 2019-09-16 NOTE — Assessment & Plan Note (Addendum)
Mild elevation in the office today, 138/86, in the setting of stopping his lisinopril-HCTZ on Friday 10/23 due to AKI.  Surprisingly BP appears to be doing relatively well off his hypertensive regimen.  After repeating BMP today to assess renal function, will likely restart.

## 2019-09-16 NOTE — Progress Notes (Signed)
Subjective:    Patient ID: Michael Stephenson, male    DOB: January 15, 1956, 63 y.o.   MRN: [id number]   CC: Repeat labs/back pain  HPI: Michael Stephenson is a 63 year old gentleman with hypertension, T2 DM, previous TIA presenting discuss the following:  Follow-up abnormal labs/back pain: He was seen by his primary care provider 2 weeks ago for bilateral hip/back pain that is been persistent for the past 4+ months and had labs at that visit.  CMP showing AKI and alk phos of 2484.  After discussion of his labs, he also endorsed he has noted some unintentional weight loss and fatigue.  His lisinopril-HCTZ was stopped due to AKI.  He is following up today for repeat labs and further discussion.  He continues to have bilateral hip pain that will radiate down his leg, pain appears to be anterior lateral.  He also notes approximately for the past 2 weeks or so that his entire right leg will briefly become numb for about a minute, only at night after he is woken up to urinate.  Leg numbness occurs after urination only.  Has not had it happen the last few nights.  He takes pressure off his leg during that time and it goes away, then able to walk without concern.  He denies any of the symptoms during the day.  He is not taking any of the gabapentin prescribed on last visit after discussion of AKI, however did not feel that it made much difference.  Denies any saddle anesthesia, bowel/bladder incontinence, weakness.    Also reports several month history of weak urinary stream, nocturia 2-3 times nightly.  No known history of prostate concerns, said his father had "prostate issues," but cannot remember if this was cancerous or not (however do see "family history of father with cancer" in his chart).  He has lost about 20 pounds since February, states this was not extremely intentional.  He had been trying to be healthy with eating and exercising, however has not really exercised in the past 4 months due to hip pains.  Has not  heard from PT yet.  He has also been working with the [employer] to try to apply for disability.  Wonders if he has "gulf syndrome" because he is chronically fatigued and had brief episode of diarrhea a month ago.   Smoking status reviewed  Review of Systems Per HPI    Objective:  BP 138/86    Pulse 78    Wt 182 lb 12.8 oz (82.9 kg)    SpO2 97%    BMI 31.38 kg/m  Vitals and nursing note reviewed  General: NAD, pleasant Cardiac: RRR, normal heart sounds  Respiratory: CTAB, normal effort Extremities: No visual deformities noted to bilateral hips or lumbar spine.  Nontender to palpation of lumbar spinous processes, or bilateral hip joints through superior portion of his IT band bilaterally.  No point tenderness over trochanteric bursa.  Full ROM of hip joints, 5/5 lower extremity strength.  2/4 patellar reflexes bilaterally.  Negative FABER bilaterally.  Normal gait. Skin: warm and dry, no rashes noted Neuro: alert and oriented, no focal deficits, sensation to light touch of bilateral lower extremity intact. Psych: normal affect  Assessment & Plan:   Hip pain Chronic and bilateral, worsening, now associated with intermittent right leg numbness.  Physical exam noncontributory.  Do have heightened concern for precipitating bony abnormalities/?mets possibly causing nerve compression in the setting of significantly elevated alkaline phosphatase of 2,484 and unintentional weight loss. Question prostate  involvement, does have some obstructive urinary symptoms as well, thus believe it would reasonable to get a PSA today.  As his hip pain is bilateral in nature, do feel this narrows the differential including less likely to be trochanteric bursitis, SI joint/ITB dysfunction, GI referred pain etc.  Will continue to consider osteoarthritis, spine radiculopathy, avascular necrosis (however does not appear to have associated risk factors with atypical presentation).  -Obtain lumbar and hip/pelvis XR with  weightbearing and non-WB films -Pending x-ray results, may need to still proceed with MRI given symptomatology -Repeat hepatic panel, BMP and obtaining GGT to further characterize alk phos elevation -PSA -Awaiting PT referral -May use Tylenol and ice/heat as needed  Alkaline phosphatase elevation Isolated elevated level of 2484 on 10/16 without additional liver function abnormalities or known liver disease.  Understandably could be in the setting of liver dysfunction versus bone, however have higher suspicion for bony abnormalities given hip pain as discussed above.  Will further assess with GGT (+ repeat alk phos level) and pending results we will continue work-up for bony concerns or obtain RUQ U/S if hepatic in origin. -Hepatic panel -BMP -GGT  Hypertension Mild elevation in the office today, 138/86, in the setting of stopping his lisinopril-HCTZ on Friday 10/23 due to AKI.  Surprisingly BP appears to be doing relatively well off his hypertensive regimen.  After repeating BMP today to assess renal function, will likely restart.    Follow-up in approximately 1 week with his primary care provider or sooner if needed.  Strict ED precautions were discussed including any development of extremity weakness, saddle anesthesia, bowel/bladder incontinence.   [facility] Resident PGY-2

## 2019-09-16 NOTE — Assessment & Plan Note (Signed)
Isolated elevated level of 2484 on 10/16 without additional liver function abnormalities or known liver disease.  Understandably could be in the setting of liver dysfunction versus bone, however have higher suspicion for bony abnormalities given hip pain as discussed above.  Will further assess with GGT (+ repeat alk phos level) and pending results we will continue work-up for bony concerns or obtain RUQ U/S if hepatic in origin. -Hepatic panel -BMP -GGT

## 2019-09-16 NOTE — Assessment & Plan Note (Addendum)
Chronic and bilateral, worsening, now associated with intermittent right leg numbness.  Physical exam noncontributory.  Do have heightened concern for precipitating bony abnormalities/?mets possibly causing nerve compression in the setting of significantly elevated alkaline phosphatase of 2,484 and unintentional weight loss. Question prostate involvement, does have some obstructive urinary symptoms as well, thus believe it would reasonable to get a PSA today.  As his hip pain is bilateral in nature, do feel this narrows the differential including less likely to be trochanteric bursitis, SI joint/ITB dysfunction, GI referred pain etc.  Will continue to consider osteoarthritis, spine radiculopathy, avascular necrosis (however does not appear to have associated risk factors with atypical presentation).  -Obtain lumbar and hip/pelvis XR with weightbearing and non-WB films -Pending x-ray results, may need to still proceed with MRI given symptomatology -Repeat hepatic panel, BMP and obtaining GGT to further characterize alk phos elevation -PSA -Awaiting PT referral -May use Tylenol and ice/heat as needed

## 2019-09-17 ENCOUNTER — Other Ambulatory Visit: Payer: Self-pay | Admitting: Family Medicine

## 2019-09-17 ENCOUNTER — Ambulatory Visit
Admission: RE | Admit: 2019-09-17 | Discharge: 2019-09-17 | Disposition: A | Discharge status: Home / Self Care | Payer: Self-pay | Source: Ambulatory Visit | Attending: Family Medicine | Admitting: Family Medicine

## 2019-09-17 DIAGNOSIS — I10 Essential (primary) hypertension: Secondary | ICD-10-CM

## 2019-09-17 DIAGNOSIS — R972 Elevated prostate specific antigen [PSA]: Secondary | ICD-10-CM

## 2019-09-17 DIAGNOSIS — M25559 Pain in unspecified hip: Secondary | ICD-10-CM

## 2019-09-17 LAB — BASIC METABOLIC PANEL
BUN/Creatinine Ratio: 18 (ref 10–24)
BUN: 18 mg/dL (ref 8–27)
CO2: 23 mmol/L (ref 20–29)
Calcium: 8.9 mg/dL (ref 8.6–10.2)
Chloride: 95 mmol/L — ABNORMAL LOW (ref 96–106)
Creatinine, Ser: 1.01 mg/dL (ref 0.76–1.27)
GFR calc Af Amer: 92 mL/min/{1.73_m2} (ref >59)
GFR calc non Af Amer: 80 mL/min/{1.73_m2} (ref >59)
Glucose: 225 mg/dL — ABNORMAL HIGH (ref 65–99)
Potassium: 4 mmol/L (ref 3.5–5.2)
Sodium: 136 mmol/L (ref 134–144)

## 2019-09-17 LAB — HEPATIC FUNCTION PANEL
ALT: 7 IU/L (ref 0–44)
AST: 17 IU/L (ref 0–40)
Albumin: 4.1 g/dL (ref 3.8–4.8)
Alkaline Phosphatase: 2436 IU/L (ref 39–117)
Bilirubin Total: 0.6 mg/dL (ref 0.0–1.2)
Bilirubin, Direct: 0.14 mg/dL (ref 0.00–0.40)
Total Protein: 6.8 g/dL (ref 6.0–8.5)

## 2019-09-17 LAB — GAMMA GT: GGT: 36 IU/L (ref 0–65)

## 2019-09-17 LAB — PSA: Prostate Specific Ag, Serum: 771 ng/mL — ABNORMAL HIGH (ref 0.0–4.0)

## 2019-09-17 LAB — PROTIME-INR
INR: 1 (ref 0.9–1.2)
Prothrombin Time: 10.7 s (ref 9.1–12.0)

## 2019-09-17 MED ORDER — AMLODIPINE BESYLATE 5 MG PO TABS: 5.0000 mg | ORAL_TABLET | Freq: Every day | ORAL | 0 refills | Status: DC

## 2019-09-24 ENCOUNTER — Ambulatory Visit (INDEPENDENT_AMBULATORY_CARE_PROVIDER_SITE_OTHER): Payer: Self-pay | Admitting: Gastroenterology

## 2019-09-24 ENCOUNTER — Encounter: Payer: Self-pay | Admitting: Gastroenterology

## 2019-09-24 ENCOUNTER — Other Ambulatory Visit: Payer: Self-pay

## 2019-09-24 VITALS — BP 126/74 | HR 80 | Temp 98.4°F | Wt 186.0 lb

## 2019-09-24 DIAGNOSIS — R634 Abnormal weight loss: Secondary | ICD-10-CM

## 2019-09-24 DIAGNOSIS — R972 Elevated prostate specific antigen [PSA]: Secondary | ICD-10-CM

## 2019-09-24 DIAGNOSIS — Z1159 Encounter for screening for other viral diseases: Secondary | ICD-10-CM

## 2019-09-24 DIAGNOSIS — R748 Abnormal levels of other serum enzymes: Secondary | ICD-10-CM

## 2019-09-24 HISTORY — DX: Elevated prostate specific antigen (PSA): R97.20

## 2019-09-24 MED ORDER — NA SULFATE-K SULFATE-MG SULF 17.5-3.13-1.6 GM/177ML PO SOLN: ORAL | 0 refills | Status: DC

## 2019-09-24 NOTE — Progress Notes (Signed)
09/24/2019 Michael Stephenson [id number] November 14, 1956   HISTORY OF PRESENT ILLNESS:  This is a 63 year old male who is new to our office and was referred here by his PCP, Dr. Dorris Singh, for complaints of black stool and weight loss.  He says that he has lost 20 pounds in the past year unintentionally.  He tells me that he had one episode of black stool several months ago, but that has not recurred.  He denies seeing any red blood in his stools.  He says that his stools tend to be loose because of his Metformin medication.  Denies abdominal pain.  He has never had colonoscopy.  I am concerned, however, that he has metastatic prostate cancer.  He has an extremely elevated alk phos level with a normal GGT.  Has been complaining of hip pain and recent x-rays show suspicion for skeletal metastases.  PSA also very elevated.  He is currently being referred to urology, but was not told of these exact results   Past Medical History:  Diagnosis Date  . Anxiety   . DM (diabetes mellitus) ([place])   . Heart murmur   . HLD (hyperlipidemia)   . Hypertension    Past Surgical History:  Procedure Laterality Date  . LAPAROSCOPIC APPENDECTOMY  03/30/2012   Procedure: APPENDECTOMY LAPAROSCOPIC;  Surgeon: Gwenyth Ober, MD;  Location: [facility];  Service: General;  Laterality: N/A;  . TONSILLECTOMY      reports that he quit smoking about 35 years ago. His smoking use included cigarettes. He has never used smokeless tobacco. He reports that he does not drink alcohol or use drugs. family history includes Benign prostatic hyperplasia in his father; Diabetes in his sister; Heart disease in his mother; Kidney disease in his mother; Stroke in his brother. No Known Allergies    Outpatient Encounter Medications as of 09/24/2019  Medication Sig  . amLODipine (NORVASC) 5 MG tablet Take 1 tablet (5 mg total) by mouth at bedtime.  . blood glucose meter kit and supplies Dispense based on patient and insurance preference. Use  up to four times daily as directed. (FOR ICD-9 250.00, 250.01).  Marland Kitchen gabapentin (NEURONTIN) 100 MG capsule Take 1 capsule (100 mg total) by mouth 3 (three) times daily.  . metFORMIN (GLUCOPHAGE) 1000 MG tablet Take 1 tablet (1,000 mg total) by mouth 2 (two) times daily with a meal.  . sildenafil (VIAGRA) 100 MG tablet Take 0.5-1 tablets (50-100 mg total) by mouth daily as needed for erectile dysfunction.  . tadalafil (ADCIRCA/CIALIS) 20 MG tablet Take 0.5-1 tablets (10-20 mg total) by mouth every other day as needed for erectile dysfunction.  . [DISCONTINUED] atorvastatin (LIPITOR) 40 MG tablet Take 1 tablet (40 mg total) by mouth daily.  . [DISCONTINUED] lisinopril-hydrochlorothiazide (PRINZIDE,ZESTORETIC) 20-25 MG tablet Take 1 tablet by mouth daily.  . [DISCONTINUED] predniSONE (STERAPRED UNI-PAK 21 TAB) 10 MG (21) TBPK tablet 6 tabs for 1 day, then 5 tabs for 1 das, then 4 tabs for 1 day, then 3 tabs for 1 day, 2 tabs for 1 day, then 1 tab for 1 day   No facility-administered encounter medications on file as of 09/24/2019.      REVIEW OF SYSTEMS  : All other systems reviewed and negative except where noted in the History of Present Illness.   PHYSICAL EXAM: BP 126/74 (BP Location: Left Arm, Patient Position: Sitting, Cuff Size: Normal)   Pulse 80   Temp 98.4 F (36.9 C)   Wt 186 lb (84.4  kg)   BMI 31.93 kg/m  General: Well developed black male in no acute distress Head: Normocephalic and atraumatic Eyes:  Sclerae anicteric, conjunctiva pink. Ears: Normal auditory acuity Lungs: Clear throughout to auscultation; no increased WOB. Heart: Regular rate and rhythm; no M/R/G. Abdomen: Soft, non-distended.  BS present.  Non-tender. Rectal:  Will be done at the time of colonoscopy. Musculoskeletal: Symmetrical with no gross deformities  Skin: No lesions on visible extremities Extremities: No edema  Neurological: Alert oriented x 4, grossly non-focal Psychological:  Alert and cooperative.  Normal mood and affect  ASSESSMENT AND PLAN: *63 year old male with complaints of weight loss of over 20 pounds in the past year unintentionally.  He has never had colonoscopy.  I am concerned, however, that he has metastatic prostate cancer.  He has an extremely elevated alk phos level with a normal GGT.  Has been complaining of hip pain and recent x-rays show suspicion for skeletal metastases.  PSA also very elevated.  He is currently being referred to urology, but was not told of these exact results.  I explained to him today in regards to the findings.  I am going to order a CT scan of the chest abdomen and pelvis to hopefully get things rolling and expedited for him.  We will schedule him for both EGD and colonoscopy to rule out any other type of GI malignancy per se.  These are scheduled Dr. Loletha Carrow.  **The risks, benefits, and alternatives to EGD and colonoscopy were discussed with the patient and he consents to proceed.   CC:  Martyn Malay, MD

## 2019-09-24 NOTE — Patient Instructions (Addendum)
If you are age 61 or older, your body mass index should be between 23-30. Your Body mass index is 31.93 kg/m. If this is out of the aforementioned range listed, please consider follow up with your Primary Care Provider.  If you are age 32 or younger, your body mass index should be between 19-25. Your Body mass index is 31.93 kg/m. If this is out of the aformentioned range listed, please consider follow up with your Primary Care Provider.   You have been scheduled for an endoscopy and colonoscopy. Please follow the written instructions given to you at your visit today. Please pick up your prep supplies at the pharmacy within the next 1-3 days. If you use inhalers (even only as needed), please bring them with you on the day of your procedure. Your physician has requested that you go to www.startemmi.com and enter the access code given to you at your visit today. This web site gives a general overview about your procedure. However, you should still follow specific instructions given to you by our office regarding your preparation for the procedure.  We have sent the following medications to your pharmacy for you to pick up at your convenience: [place] have been scheduled for a CT scan of the abdomen and pelvis at [facility] ([address]---this is in the same building as [employer]).   You are scheduled on 10/03/19 at 8:30 am. You should arrive 15 minutes prior to your appointment time for registration. Please follow the written instructions below on the day of your exam:  WARNING: IF YOU ARE ALLERGIC TO IODINE/X-RAY DYE, PLEASE NOTIFY RADIOLOGY IMMEDIATELY AT [phone]! YOU WILL BE GIVEN A 13 HOUR PREMEDICATION PREP.  1) Do not eat or drink anything after 4:30 am (4 hours prior to your test) 2) You have been given 2 bottles of oral contrast to drink. The solution may taste better if refrigerated, but do NOT add ice or any other liquid to this solution. Shake  well before drinking.    Drink 1 bottle of contrast @ 6:30 AM (2 hours prior to your exam)  Drink 1 bottle of contrast @ 7:30 AM (1 hour prior to your exam)  You may take any medications as prescribed with a small amount of water, if necessary. If you take any of the following medications: METFORMIN, GLUCOPHAGE, GLUCOVANCE, AVANDAMET, RIOMET, FORTAMET, [place] MET, JANUMET, GLUMETZA or METAGLIP, you MAY be asked to HOLD this medication 48 hours AFTER the exam.  The purpose of you drinking the oral contrast is to aid in the visualization of your intestinal tract. The contrast solution may cause some diarrhea. Depending on your individual set of symptoms, you may also receive an intravenous injection of x-ray contrast/dye. Plan on being at [facility] for 30 minutes or longer, depending on the type of exam you are having performed.  This test typically takes 30-45 minutes to complete.  If you have any questions regarding your exam or if you need to reschedule, you may call the CT department at [phone] between the hours of 8:00 am and 5:00 pm, Monday-Friday.  Thank you for choosing me and [facility].   Alonza Bogus, PA-C

## 2019-09-25 NOTE — Progress Notes (Signed)
____________________________________________________________  Attending physician addendum:  Thank you for sending this case to me. I have reviewed the entire note, and the outlined plan seems appropriate.  Not clear if black stool was truly from bleeding.  With weight loss, EGD and colonoscopy reasonable.   Alk Phos > 2400 and PSA > 770 with Xray findings indicating metastatic prostate cancer most likely explain weight loss.  Wilfrid Lund, MD  ____________________________________________________________

## 2019-09-26 LAB — SARS CORONAVIRUS 2 (TAT 6-24 HRS): SARS Coronavirus 2: NEGATIVE

## 2019-09-29 ENCOUNTER — Encounter: Payer: Self-pay | Admitting: Gastroenterology

## 2019-09-29 ENCOUNTER — Other Ambulatory Visit: Payer: Self-pay

## 2019-09-29 ENCOUNTER — Ambulatory Visit (AMBULATORY_SURGERY_CENTER): Payer: Self-pay | Admitting: Gastroenterology

## 2019-09-29 VITALS — BP 158/87 | HR 71 | Temp 98.7°F | Resp 20 | Ht 68.0 in | Wt 186.0 lb

## 2019-09-29 DIAGNOSIS — K2950 Unspecified chronic gastritis without bleeding: Secondary | ICD-10-CM

## 2019-09-29 DIAGNOSIS — R634 Abnormal weight loss: Secondary | ICD-10-CM

## 2019-09-29 DIAGNOSIS — B9681 Helicobacter pylori [H. pylori] as the cause of diseases classified elsewhere: Secondary | ICD-10-CM

## 2019-09-29 DIAGNOSIS — K3189 Other diseases of stomach and duodenum: Secondary | ICD-10-CM

## 2019-09-29 DIAGNOSIS — K449 Diaphragmatic hernia without obstruction or gangrene: Secondary | ICD-10-CM

## 2019-09-29 DIAGNOSIS — K921 Melena: Secondary | ICD-10-CM

## 2019-09-29 MED ORDER — SODIUM CHLORIDE 0.9 % IV SOLN: 500.0000 mL | Freq: Once | INTRAVENOUS | Status: DC

## 2019-09-29 NOTE — Op Note (Signed)
[facility] Patient Name: Michael Stephenson Procedure Date: 09/29/2019 3:31 PM MRN: [id number] Endoscopist: Mallie Mussel L. Loletha Carrow , MD Age: 63 Referring MD:  Date of Birth: May 01, 1956 Gender: Male Account #: [id number] Procedure:                Colonoscopy Indications:              Weight loss (recent testing revealed markedly                            elevated alkaline phosphatase and PSA as well as                            lumbar spine Xrays demonstrating possible                            metastatic lesion) Medicines:                Monitored Anesthesia Care Procedure:                Pre-Anesthesia Assessment:                           - Prior to the procedure, a History and Physical                            was performed, and patient medications and                            allergies were reviewed. The patient's tolerance of                            previous anesthesia was also reviewed. The risks                            and benefits of the procedure and the sedation                            options and risks were discussed with the patient.                            All questions were answered, and informed consent                            was obtained. Prior Anticoagulants: The patient has                            taken no previous anticoagulant or antiplatelet                            agents. ASA Grade Assessment: II - A patient with                            mild systemic disease. After reviewing the risks  and benefits, the patient was deemed in                            satisfactory condition to undergo the procedure.                           After obtaining informed consent, the colonoscope                            was passed under direct vision. Throughout the                            procedure, the patient's blood pressure, pulse, and                            oxygen saturations were monitored continuously. The                         Colonoscope was introduced through the anus and                            advanced to the the cecum, identified by                            appendiceal orifice and ileocecal valve. The                            colonoscopy was performed without difficulty. The                            patient tolerated the procedure well. The quality                            of the bowel preparation was good. The ileocecal                            valve, appendiceal orifice, and rectum were                            photographed. Scope In: 3:51:42 PM Scope Out: 4:03:30 PM Scope Withdrawal Time: 0 hours 6 minutes 20 seconds  Total Procedure Duration: 0 hours 11 minutes 48 seconds  Findings:                 The digital rectal exam findings include A MARKEDLY                            ENLARGED AND FIRM PROSTATE.                           The entire examined colon appeared normal on direct                            and retroflexion views. Complications:            No immediate complications. Estimated Blood Loss:  Estimated blood loss: none. Impression:               - Enlarged prostate found on digital rectal exam.                           - The entire examined colon is normal on direct and                            retroflexion views.                           - No specimens collected.                           No GI source on either EGD or colonoscopy for                            weight loss, which appears to be from prostate                            cancer. Recommendation:           - Patient has a contact number available for                            emergencies. The signs and symptoms of potential                            delayed complications were discussed with the                            patient. Return to normal activities tomorrow.                            Written discharge instructions were provided to the                            patient.                            - Resume previous diet.                           - Continue present medications.                           - Repeat colonoscopy in 10 years for screening                            purposes.                           - Chart messages will be sent to patient's primary                            care provider to expedite urology and oncology  evaluations. Patient reportedly referred, but has                            not been notified of any upcoming appointments. Leira Regino L. Loletha Carrow, MD 09/29/2019 4:14:45 PM This report has been signed electronically.

## 2019-09-29 NOTE — Progress Notes (Signed)
Called to room to assist during endoscopic procedure.  Patient ID and intended procedure confirmed with present staff. Received instructions for my participation in the procedure from the performing physician.  

## 2019-09-29 NOTE — Patient Instructions (Signed)
Thank you for allowing [place] to care for you today!  Await biopsy results from upper endoscopy.  Will make needed recommendations, if any, at that time.  Resume previous diet and medications today.  Return to your normal activities tomorrow.  Recommend next screening colonoscopy in 10 years.   YOU HAD AN ENDOSCOPIC PROCEDURE TODAY AT [facility]:   Refer to the procedure report that was given to you for any specific questions about what was found during the examination.  If the procedure report does not answer your questions, please call your gastroenterologist to clarify.  If you requested that your care partner not be given the details of your procedure findings, then the procedure report has been included in a sealed envelope for you to review at your convenience later.  YOU SHOULD EXPECT: Some feelings of bloating in the abdomen. Passage of more gas than usual.  Walking can help get rid of the air that was put into your GI tract during the procedure and reduce the bloating. If you had a lower endoscopy (such as a colonoscopy or flexible sigmoidoscopy) you may notice spotting of blood in your stool or on the toilet paper. If you underwent a bowel prep for your procedure, you may not have a normal bowel movement for a few days.  Please Note:  You might notice some irritation and congestion in your nose or some drainage.  This is from the oxygen used during your procedure.  There is no need for concern and it should clear up in a day or so.  SYMPTOMS TO REPORT IMMEDIATELY:   Following lower endoscopy (colonoscopy or flexible sigmoidoscopy):  Excessive amounts of blood in the stool  Significant tenderness or worsening of abdominal pains  Swelling of the abdomen that is new, acute  Fever of 100F or higher   Following upper endoscopy (EGD)  Vomiting of blood or coffee ground material  New chest pain or pain under the shoulder blades  Painful or persistently difficult  swallowing  New shortness of breath  Fever of 100F or higher  Black, tarry-looking stools  For urgent or emergent issues, a gastroenterologist can be reached at any hour by calling [phone].   DIET:  We do recommend a small meal at first, but then you may proceed to your regular diet.  Drink plenty of fluids but you should avoid alcoholic beverages for 24 hours.  ACTIVITY:  You should plan to take it easy for the rest of today and you should NOT DRIVE or use heavy machinery until tomorrow (because of the sedation medicines used during the test).    FOLLOW UP: Our staff will call the number listed on your records 48-72 hours following your procedure to check on you and address any questions or concerns that you may have regarding the information given to you following your procedure. If we do not reach you, we will leave a message.  We will attempt to reach you two times.  During this call, we will ask if you have developed any symptoms of COVID 19. If you develop any symptoms (ie: fever, flu-like symptoms, shortness of breath, cough etc.) before then, please call [phone].  If you test positive for Covid 19 in the 2 weeks post procedure, please call and report this information to [place].    If any biopsies were taken you will be contacted by phone or by letter within the next 1-3 weeks.  Please call us at [phone] if you have  not heard about the biopsies in 3 weeks.    SIGNATURES/CONFIDENTIALITY: You and/or your care partner have signed paperwork which will be entered into your electronic medical record.  These signatures attest to the fact that that the information above on your After Visit Summary has been reviewed and is understood.  Full responsibility of the confidentiality of this discharge information lies with you and/or your care-partner.

## 2019-09-29 NOTE — Op Note (Signed)
[facility] Patient Name: Michael Stephenson Procedure Date: 09/29/2019 3:31 PM MRN: [id number] Endoscopist: Mallie Mussel L. Loletha Carrow , MD Age: 63 Referring MD:  Date of Birth: 03-Jan-1956 Gender: Male Account #: [id number] Procedure:                Upper GI endoscopy Indications:              Weight loss; episode of "black stool" within last                            few months Medicines:                Monitored Anesthesia Care Procedure:                Pre-Anesthesia Assessment:                           - Prior to the procedure, a History and Physical                            was performed, and patient medications and                            allergies were reviewed. The patient's tolerance of                            previous anesthesia was also reviewed. The risks                            and benefits of the procedure and the sedation                            options and risks were discussed with the patient.                            All questions were answered, and informed consent                            was obtained. Prior Anticoagulants: The patient has                            taken no previous anticoagulant or antiplatelet                            agents. ASA Grade Assessment: II - A patient with                            mild systemic disease. After reviewing the risks                            and benefits, the patient was deemed in                            satisfactory condition to undergo the procedure.  After obtaining informed consent, the endoscope was                            passed under direct vision. Throughout the                            procedure, the patient's blood pressure, pulse, and                            oxygen saturations were monitored continuously. The                            Endoscope was introduced through the mouth, and                            advanced to the second part of duodenum. The  upper                            GI endoscopy was accomplished without difficulty.                            The patient tolerated the procedure well. Scope In: Scope Out: Findings:                 A small hiatal hernia was present.                           Diffuse inflammation characterized by nodular                            erythema was found in the gastric antrum. Biopsies                            were taken with a cold forceps for histology.                           The exam of the stomach was otherwise normal.                           The examined duodenum was normal. Complications:            No immediate complications. Estimated Blood Loss:     Estimated blood loss was minimal. Impression:               The reported black stool does not appear to have                            been UGI bleeding.                           - Small hiatal hernia.                           - Gastritis. Biopsied.                           -  Normal examined duodenum. Recommendation:           - Patient has a contact number available for                            emergencies. The signs and symptoms of potential                            delayed complications were discussed with the                            patient. Return to normal activities tomorrow.                            Written discharge instructions were provided to the                            patient.                           - Resume previous diet.                           - Continue present medications.                           - Await pathology results.                           - See the other procedure note for documentation of                            additional recommendations. Henry L. Loletha Carrow, MD 09/29/2019 4:08:38 PM This report has been signed electronically.

## 2019-09-29 NOTE — Progress Notes (Signed)
Pt tolerated well. VSS. Arousable to recovery.

## 2019-09-29 NOTE — Progress Notes (Signed)
Temp check by:[place] Vital check by:[place]  The medical and surgical history was reviewed and verified with the patient.

## 2019-09-30 ENCOUNTER — Telehealth: Payer: Self-pay | Admitting: Family Medicine

## 2019-09-30 NOTE — Telephone Encounter (Signed)
Fortunately reached Michael Stephenson this morning through his spouse's phone number, as he has been difficult to contact. Attempted reaching him on 11/4 and 11/6 x2 with no answer and left voicemail, as well as yesterday, 11/9, x2 in the morning and afternoon.   He has not heard from urology yet, provided him with [facility]'s phone number to call.  I also reached out to [facility] on 10/29 after placing the urgent referral to hopefully expedite the process, however will also call them today to check on status.  Lastly, will touch base with the [facility] coordinator to further help navigate his care.   Answered all of his questions and concerns, provided him with our clinic number again if any further questions or can not reach Urology.   Patriciaann Clan, DO

## 2019-10-01 ENCOUNTER — Telehealth: Payer: Self-pay | Admitting: *Deleted

## 2019-10-01 ENCOUNTER — Telehealth: Payer: Self-pay

## 2019-10-01 NOTE — Telephone Encounter (Signed)
Left message on f/u call 

## 2019-10-01 NOTE — Telephone Encounter (Signed)
  Follow up Call-  Call back number 09/29/2019  Post procedure Call Back phone  # [phone]  Permission to leave phone message Yes  Some recent data might be hidden    [facility]

## 2019-10-01 NOTE — Telephone Encounter (Signed)
Pt returned call stating that he is doing well after his procedure.

## 2019-10-02 ENCOUNTER — Encounter: Payer: Self-pay | Admitting: Gastroenterology

## 2019-10-03 ENCOUNTER — Encounter: Payer: Self-pay | Admitting: *Deleted

## 2019-10-03 ENCOUNTER — Ambulatory Visit (HOSPITAL_COMMUNITY): Payer: Self-pay

## 2019-10-03 ENCOUNTER — Other Ambulatory Visit: Payer: Self-pay | Admitting: *Deleted

## 2019-10-03 DIAGNOSIS — A048 Other specified bacterial intestinal infections: Secondary | ICD-10-CM

## 2019-10-03 MED ORDER — DOXYCYCLINE HYCLATE 100 MG PO CAPS: 100.0000 mg | ORAL_CAPSULE | Freq: Two times a day (BID) | ORAL | 0 refills | Status: AC

## 2019-10-03 MED ORDER — BISMUTH SUBSALICYLATE 262 MG PO CHEW: 524.0000 mg | CHEWABLE_TABLET | Freq: Four times a day (QID) | ORAL | 0 refills | Status: AC

## 2019-10-03 MED ORDER — METRONIDAZOLE 250 MG PO TABS: 250.0000 mg | ORAL_TABLET | Freq: Four times a day (QID) | ORAL | 0 refills | Status: AC

## 2019-10-06 ENCOUNTER — Telehealth: Payer: Self-pay | Admitting: Gastroenterology

## 2019-10-06 ENCOUNTER — Telehealth: Payer: Self-pay | Admitting: *Deleted

## 2019-10-06 NOTE — Telephone Encounter (Signed)
I do not think this is related to the recent colonoscopy.  Please advise him to have someone drive him to PCP office today or, if cannot be seen there, to an urgent care.

## 2019-10-06 NOTE — Telephone Encounter (Signed)
Spoke to the patient who reports "double vision" stating 3 days after colon which was performed on 11/9. The patient stated when he covers his right eye "it seems as though my left eye then attempts to focus on something to the right." He reports he sees two of everything. The double vision never goes away. When asked if the patient can drive, he stated "very carefully." The patient does not where glasses but stated the only impairment he has be dx with is nearsightedness. The patient denies h/a, N/V, dizziness and vertigo. The patient has been told to reach out to his PCP as well. The patient thought that this double vision was in some way related to the colonoscopy.

## 2019-10-06 NOTE — Telephone Encounter (Signed)
Pt lm on nurse line that he was having double vision.    Attempted to call back, no answer.  LMOVM for pt to go to urgent care as we have no openings. Christen Bame, CMA

## 2019-10-06 NOTE — Telephone Encounter (Signed)
Pt states that he has been having double vision. He states that it began several days after his procedure, he wants to know if it could related to the procedure.

## 2019-10-06 NOTE — Telephone Encounter (Signed)
Spoke to the patient who reported he contacted his PCP who also suggested he go to an Urgent Care. Patient stated he would go to an urgent care to be evaluated.

## 2019-10-07 NOTE — Telephone Encounter (Signed)
Made social call to patient. No answer. Left voicemail to call office back. [phone].

## 2019-10-07 NOTE — Telephone Encounter (Signed)
Patient calls nurse line returning PCP call. Patient states he still has blurry vision and is dizzy this am. Patient denies fever, SOB, N/V or any other symptoms. Patient stated this all started yesterday, he has been lying down to relieve symptoms. We have an opening on ATC on Thursday. I scheduled him for this at 9:50am.

## 2019-10-09 ENCOUNTER — Ambulatory Visit (INDEPENDENT_AMBULATORY_CARE_PROVIDER_SITE_OTHER): Payer: Self-pay | Admitting: Family Medicine

## 2019-10-09 ENCOUNTER — Other Ambulatory Visit: Payer: Self-pay

## 2019-10-09 DIAGNOSIS — H532 Diplopia: Secondary | ICD-10-CM

## 2019-10-09 MED ORDER — METFORMIN HCL ER 500 MG PO TB24: 1000.0000 mg | ORAL_TABLET | Freq: Every day | ORAL | 3 refills | Status: AC

## 2019-10-09 NOTE — Progress Notes (Signed)
   Subjective:    Michael Stephenson - 63 y.o. male MRN [id number]  Date of birth: 01/16/1956  CC:  Michael Stephenson is here for double vision.  HPI: Occurred one week ago Noticed floaters as well, but no flashes of light No periods of blurry vision or complete loss of vision Vision has stayed the same since symptoms were first noticed No focal weakness or changes in sensorium This has never occurred before Denies headache Is planning to see urology next week due to significantly elevated PSA and alkaline phosphatase and likely metastases visualized on hip x-ray  Health Maintenance:  Health Maintenance Due  Topic Date Due  . PNEUMOCOCCAL POLYSACCHARIDE VACCINE AGE 28-64 HIGH RISK  03/19/1960  . FOOT EXAM  03/19/1968  . OPHTHALMOLOGY EXAM  03/19/1968  . URINE MICROALBUMIN  03/19/1968  . TETANUS/TDAP  03/19/1977    -  reports that he quit smoking about 35 years ago. His smoking use included cigarettes. He has never used smokeless tobacco. - Review of Systems: Per HPI. - Past Medical History: Patient Active Problem List   Diagnosis Date Noted  . Diplopia 10/10/2019  . Loss of weight 09/24/2019  . Elevated PSA 09/24/2019  . Screening for viral disease 09/24/2019  . Elevated alkaline phosphatase level 09/16/2019  . Black stool 09/05/2019  . Hip pain 09/05/2019  . Libido, decreased 03/27/2019  . Numbness and tingling of left lower extremity 03/12/2019  . Uncontrolled type 2 diabetes mellitus without complication, without long-term current use of insulin 01/02/2019  . Primary hypertension 01/02/2019  . Palpitations 01/02/2019  . Encounter for annual physical exam 01/02/2019  . Type 2 diabetes mellitus with complication, without long-term current use of insulin ([place]) 04/28/2016  . Erectile dysfunction 04/28/2016  . Other specified transient cerebral ischemias   . Paresthesias   . Diabetes mellitus, new onset ([place]) 04/03/2015  . TIA (transient ischemic attack) 04/03/2015  . Postop  check 04/16/2012  . Hypertension 04/16/2012   - Medications: reviewed and updated   Objective:   Physical Exam BP 122/78   Pulse 77   Wt 176 lb 12.8 oz (80.2 kg)   SpO2 98%   BMI 26.88 kg/m  Gen: NAD, alert, cooperative with exam, well-appearing HEENT: NCAT, PERRL, clear conjunctiva, inability to move left eye laterally on visual field testing Neuro: No gross deficits in cranial nerves except for CN VI on the left side, no focal weakness or paresthesia Psych: good insight, alert and oriented     Assessment & Plan:   Diplopia Due to left abducens nerve palsy.  Concern that there may be a mass-effect on this nerve, so we will obtain an MRI brain with and without contrast.  We will also place and urgent referral to neurology.  Patient would also likely benefit from evaluation by ophthalmology, although this is likely a nerve problem rather than an intrinsic eye problem.  It is possible that metastasis from his likely prostate cancer could be causing his symptoms, although prostate cancer metastases to the brain are unusual.    Maia Breslow, M.D. 10/10/2019, 9:11 AM PGY-3, [facility]

## 2019-10-09 NOTE — Patient Instructions (Signed)
It was nice meeting you today [name]!  Today, we are ordering an MRI of your brain to further investigate your double vision.  I am also placing an urgent referral with neurology so that hopefully they can see you in the next week or 2.  You may need to see an eye doctor as well, but we need to go through the steps first.  I have sent in Metformin XR, which you can take 2 pills once a day at the same time.  If you still have diarrhea with this, you can take 1 pill once a day.  You can take it anytime of day with that works for you.  If you have any questions or concerns, please feel free to call the clinic.   Be well,  Dr. Shan Levans

## 2019-10-10 DIAGNOSIS — H532 Diplopia: Secondary | ICD-10-CM | POA: Insufficient documentation

## 2019-10-10 NOTE — Assessment & Plan Note (Addendum)
Due to left abducens nerve palsy.  Concern that there may be a mass-effect on this nerve, so we will obtain an MRI brain with and without contrast.  We will also place and urgent referral to neurology.  Patient would also likely benefit from evaluation by ophthalmology, although this is likely a nerve problem rather than an intrinsic eye problem.  It is possible that metastasis from his likely prostate cancer could be causing his symptoms, although prostate cancer metastases to the brain are unusual.

## 2019-10-14 ENCOUNTER — Encounter: Payer: Self-pay | Admitting: Neurology

## 2019-10-23 ENCOUNTER — Telehealth: Payer: Self-pay | Admitting: Oncology

## 2019-10-23 NOTE — Telephone Encounter (Signed)
Received a new patient referral from [facility] for metastatic prostate cancer. I cld and lft the pt a vm to schedule an appt.

## 2019-10-27 NOTE — Progress Notes (Deleted)
NEUROLOGY CONSULTATION NOTE  Michael Stephenson MRN: [id number] DOB: [birth year]  Referring provider: Madison Hickman, MD Primary care provider: Gerlene Fee, DO  Reason for consult:  diplopia  HISTORY OF PRESENT ILLNESS: Michael Stephenson is a 63 year old male with HTN, DM, and HLD who presents for diplopia.  History supplemented by referring provider's note.  MRI and MRA of head from 04/04/2015 personally reviewed.  ***  Hgb A1c from 09/05/2019 was 7.3.  Of note, he was admitted to [facility] in May 2016 for TIA presenting as transient left-sided numbness.  MRI of brain at that time was unremarkable.  MRA of head showed high-grade stenosis of right P1-2 junction and midgrade stenosis mid basilar artery associated tortuosity.    PAST MEDICAL HISTORY: Past Medical History:  Diagnosis Date  . Anxiety   . DM (diabetes mellitus) ([place])   . Heart murmur   . HLD (hyperlipidemia)   . Hypertension     PAST SURGICAL HISTORY: Past Surgical History:  Procedure Laterality Date  . LAPAROSCOPIC APPENDECTOMY  03/30/2012   Procedure: APPENDECTOMY LAPAROSCOPIC;  Surgeon: Gwenyth Ober, MD;  Location: [place];  Service: General;  Laterality: N/A;  . TONSILLECTOMY      MEDICATIONS: Current Outpatient Medications on File Prior to Visit  Medication Sig Dispense Refill  . amLODipine (NORVASC) 5 MG tablet Take 1 tablet (5 mg total) by mouth at bedtime. 30 tablet 0  . blood glucose meter kit and supplies Dispense based on patient and insurance preference. Use up to four times daily as directed. (FOR ICD-9 250.00, 250.01). 1 each 0  . gabapentin (NEURONTIN) 100 MG capsule Take 1 capsule (100 mg total) by mouth 3 (three) times daily. 90 capsule 3  . metFORMIN (GLUCOPHAGE) 1000 MG tablet Take 1 tablet (1,000 mg total) by mouth 2 (two) times daily with a meal. 60 tablet 2  . metFORMIN (GLUCOPHAGE-XR) 500 MG 24 hr tablet Take 2 tablets (1,000 mg total) by mouth daily with breakfast. 180 tablet 3   . sildenafil (VIAGRA) 100 MG tablet Take 0.5-1 tablets (50-100 mg total) by mouth daily as needed for erectile dysfunction. 10 tablet 0  . tadalafil (ADCIRCA/CIALIS) 20 MG tablet Take 0.5-1 tablets (10-20 mg total) by mouth every other day as needed for erectile dysfunction. 5 tablet 11  . [DISCONTINUED] atorvastatin (LIPITOR) 40 MG tablet Take 1 tablet (40 mg total) by mouth daily. 90 tablet 3   No current facility-administered medications on file prior to visit.     ALLERGIES: No Known Allergies  FAMILY HISTORY: Family History  Problem Relation Age of Onset  . Benign prostatic hyperplasia Father   . Heart disease Mother   . Kidney disease Mother   . Diabetes Sister   . Stroke Brother   . Colon cancer Neg Hx   . Esophageal cancer Neg Hx   . Stomach cancer Neg Hx   . Rectal cancer Neg Hx    ***.  SOCIAL HISTORY: Social History   Socioeconomic History  . Marital status: Married    Spouse name: Not on file  . Number of children: 3  . Years of education: Not on file  . Highest education level: Not on file  Occupational History  . Not on file  Social Needs  . Financial resource strain: Not on file  . Food insecurity    Worry: Not on file    Inability: Not on file  . Transportation needs    Medical: Not on file  Non-medical: Not on file  Tobacco Use  . Smoking status: Former Smoker    Types: Cigarettes    Quit date: 1985    Years since quitting: 35.9  . Smokeless tobacco: Never Used  Substance and Sexual Activity  . Alcohol use: No  . Drug use: No  . Sexual activity: Not on file  Lifestyle  . Physical activity    Days per week: Not on file    Minutes per session: Not on file  . Stress: Not on file  Relationships  . Social Herbalist on phone: Not on file    Gets together: Not on file    Attends religious service: Not on file    Active member of club or organization: Not on file    Attends meetings of clubs or organizations: Not on file     Relationship status: Not on file  . Intimate partner violence    Fear of current or ex partner: Not on file    Emotionally abused: Not on file    Physically abused: Not on file    Forced sexual activity: Not on file  Other Topics Concern  . Not on file  Social History Narrative  . Not on file    REVIEW OF SYSTEMS: Constitutional: No fevers, chills, or sweats, no generalized fatigue, change in appetite Eyes: No visual changes, double vision, eye pain Ear, nose and throat: No hearing loss, ear pain, nasal congestion, sore throat Cardiovascular: No chest pain, palpitations Respiratory:  No shortness of breath at rest or with exertion, wheezes GastrointestinaI: No nausea, vomiting, diarrhea, abdominal pain, fecal incontinence Genitourinary:  No dysuria, urinary retention or frequency Musculoskeletal:  No neck pain, back pain Integumentary: No rash, pruritus, skin lesions Neurological: as above Psychiatric: No depression, insomnia, anxiety Endocrine: No palpitations, fatigue, diaphoresis, mood swings, change in appetite, change in weight, increased thirst Hematologic/Lymphatic:  No purpura, petechiae. Allergic/Immunologic: no itchy/runny eyes, nasal congestion, recent allergic reactions, rashes  PHYSICAL EXAM: *** General: No acute distress.  Patient appears ***-groomed.  *** Head:  Normocephalic/atraumatic Eyes:  fundi examined but not visualized Neck: supple, no paraspinal tenderness, full range of motion Back: No paraspinal tenderness Heart: regular rate and rhythm Lungs: Clear to auscultation bilaterally. Vascular: No carotid bruits. Neurological Exam: Mental status: alert and oriented to person, place, and time, recent and remote memory intact, fund of knowledge intact, attention and concentration intact, speech fluent and not dysarthric, language intact. Cranial nerves: CN I: not tested CN II: pupils equal, round and reactive to light, visual fields intact CN III, IV, VI:   full range of motion, no nystagmus, no ptosis CN V: facial sensation intact CN VII: upper and lower face symmetric CN VIII: hearing intact CN IX, X: gag intact, uvula midline CN XI: sternocleidomastoid and trapezius muscles intact CN XII: tongue midline Bulk & Tone: normal, no fasciculations. Motor:  5/5 throughout *** Sensation:  [place] *** temperature *** and vibration sensation intact.  ***. Deep Tendon Reflexes:  2+ throughout, *** toes downgoing.  *** Finger to nose testing:  Without dysmetria.  *** Heel to shin:  Without dysmetria.  *** Gait:  Normal station and stride.  Able to turn and tandem walk. [place] ***.  IMPRESSION: ***  PLAN: ***  Thank you for allowing me to take part in the care of this patient.  Metta Clines, DO  CC: ***

## 2019-10-29 ENCOUNTER — Ambulatory Visit: Payer: Self-pay | Admitting: Neurology

## 2019-10-29 ENCOUNTER — Other Ambulatory Visit: Payer: Self-pay

## 2019-10-29 ENCOUNTER — Inpatient Hospital Stay: Payer: Self-pay | Attending: Oncology | Admitting: Oncology

## 2019-10-29 VITALS — BP 160/81 | HR 93 | Temp 98.3°F | Resp 18 | Ht 68.0 in | Wt 171.2 lb

## 2019-10-29 DIAGNOSIS — R748 Abnormal levels of other serum enzymes: Secondary | ICD-10-CM

## 2019-10-29 DIAGNOSIS — C7951 Secondary malignant neoplasm of bone: Secondary | ICD-10-CM

## 2019-10-29 DIAGNOSIS — Z800 Family history of malignant neoplasm of digestive organs: Secondary | ICD-10-CM

## 2019-10-29 DIAGNOSIS — Z87891 Personal history of nicotine dependence: Secondary | ICD-10-CM

## 2019-10-29 DIAGNOSIS — E119 Type 2 diabetes mellitus without complications: Secondary | ICD-10-CM

## 2019-10-29 DIAGNOSIS — C61 Malignant neoplasm of prostate: Secondary | ICD-10-CM

## 2019-10-29 DIAGNOSIS — M898X9 Other specified disorders of bone, unspecified site: Secondary | ICD-10-CM | POA: Insufficient documentation

## 2019-10-29 DIAGNOSIS — Z5111 Encounter for antineoplastic chemotherapy: Secondary | ICD-10-CM | POA: Insufficient documentation

## 2019-10-29 DIAGNOSIS — I10 Essential (primary) hypertension: Secondary | ICD-10-CM

## 2019-10-29 DIAGNOSIS — E291 Testicular hypofunction: Secondary | ICD-10-CM

## 2019-10-29 NOTE — Progress Notes (Signed)
Reason for the request:    Prostate cancer  HPI: I was asked by Dr. Claudia Desanctis to evaluate [name] for diagnosis of prostate cancer.  He is a 63 year old man with a history of hypertension, diabetes who has been experiencing and weight loss and bone pain for the last year or so.  He is trying to get care at the [facility] hospital but was not successful.  He was evaluated by a new primary care provider for routine physical prior to obtaining a job and was found to have a PSA of 771 with elevated alkaline phosphatase.  He was evaluated by Dr. Claudia Desanctis at [facility] urology and a biopsy obtained of his prostate after digital rectal examination showed a hard left lobe concerning for cancer.  Biopsy obtained on October 20, 2019 showed a Gleason score 4+5 = 9 left apex core with prostate cancer.  He had additional core of Gleason 9 as well as 4 other cores of Gleason 7 and Gleason 8 for a total of 6 cores.  He also had a right apex 4+3 equal 7 prostate cancer.  Based on these findings he was referred to me for evaluation.  He does report urinary symptoms including nocturia and frequency at times but no urgency or hesitancy.  He does have diffuse bone pain predominantly in his right hip and leg.  Does report shoulder and back pain as well but overall his pain is very mild although he has reported close to 30 pound weight loss in the last year.  His performance status remains adequate and continues to attend to activities of daily living.  He  does not report any headaches, blurry vision, syncope or seizures. Does not report any fevers, chills or sweats.  Does not report any cough, wheezing or hemoptysis.  Does not report any chest pain, palpitation, orthopnea or leg edema.  Does not report any nausea, vomiting or abdominal pain.  Does not report any constipation or diarrhea.  Does not report any skeletal complaints.    Does not report frequency, urgency or hematuria.  Does not report any skin rashes or lesions. Does not report any  heat or cold intolerance.  Does not report any lymphadenopathy or petechiae.  Does not report any anxiety or depression.  Remaining review of systems is negative.    Past Medical History:  Diagnosis Date  . Anxiety   . DM (diabetes mellitus) ([place])   . Heart murmur   . HLD (hyperlipidemia)   . Hypertension   :  Past Surgical History:  Procedure Laterality Date  . LAPAROSCOPIC APPENDECTOMY  03/30/2012   Procedure: APPENDECTOMY LAPAROSCOPIC;  Surgeon: Gwenyth Ober, MD;  Location: [facility];  Service: General;  Laterality: N/A;  . TONSILLECTOMY    :   Current Outpatient Medications:  .  amLODipine (NORVASC) 5 MG tablet, Take 1 tablet (5 mg total) by mouth at bedtime., Disp: 30 tablet, Rfl: 0 .  blood glucose meter kit and supplies, Dispense based on patient and insurance preference. Use up to four times daily as directed. (FOR ICD-9 250.00, 250.01)., Disp: 1 each, Rfl: 0 .  gabapentin (NEURONTIN) 100 MG capsule, Take 1 capsule (100 mg total) by mouth 3 (three) times daily., Disp: 90 capsule, Rfl: 3 .  metFORMIN (GLUCOPHAGE) 1000 MG tablet, Take 1 tablet (1,000 mg total) by mouth 2 (two) times daily with a meal., Disp: 60 tablet, Rfl: 2 .  metFORMIN (GLUCOPHAGE-XR) 500 MG 24 hr tablet, Take 2 tablets (1,000 mg total) by mouth daily  with breakfast., Disp: 180 tablet, Rfl: 3 .  sildenafil (VIAGRA) 100 MG tablet, Take 0.5-1 tablets (50-100 mg total) by mouth daily as needed for erectile dysfunction., Disp: 10 tablet, Rfl: 0 .  tadalafil (ADCIRCA/CIALIS) 20 MG tablet, Take 0.5-1 tablets (10-20 mg total) by mouth every other day as needed for erectile dysfunction., Disp: 5 tablet, Rfl: 11:  No Known Allergies:  Family History  Problem Relation Age of Onset  . Benign prostatic hyperplasia Father   . Heart disease Mother   . Kidney disease Mother   . Diabetes Sister   . Stroke Brother   . Colon cancer Neg Hx   . Esophageal cancer Neg Hx   . Stomach cancer Neg Hx   . Rectal cancer Neg Hx    :  Social History   Socioeconomic History  . Marital status: Married    Spouse name: Not on file  . Number of children: 3  . Years of education: Not on file  . Highest education level: Not on file  Occupational History  . Not on file  Social Needs  . Financial resource strain: Not on file  . Food insecurity    Worry: Not on file    Inability: Not on file  . Transportation needs    Medical: Not on file    Non-medical: Not on file  Tobacco Use  . Smoking status: Former Smoker    Types: Cigarettes    Quit date: 1985    Years since quitting: 35.9  . Smokeless tobacco: Never Used  Substance and Sexual Activity  . Alcohol use: No  . Drug use: No  . Sexual activity: Not on file  Lifestyle  . Physical activity    Days per week: Not on file    Minutes per session: Not on file  . Stress: Not on file  Relationships  . Social Herbalist on phone: Not on file    Gets together: Not on file    Attends religious service: Not on file    Active member of club or organization: Not on file    Attends meetings of clubs or organizations: Not on file    Relationship status: Not on file  . Intimate partner violence    Fear of current or ex partner: Not on file    Emotionally abused: Not on file    Physically abused: Not on file    Forced sexual activity: Not on file  Other Topics Concern  . Not on file  Social History Narrative  . Not on file  :  Pertinent items are noted in HPI.  Exam: Blood pressure (!) 160/81, pulse 93, temperature 98.3 F (36.8 C), temperature source Temporal, resp. rate 18, height '5\' 8"'  (1.727 m), weight 171 lb 3.2 oz (77.7 kg), SpO2 100 %.  ECOG 1. General appearance: alert and cooperative appeared without distress. Head: atraumatic without any abnormalities. Eyes: conjunctivae/corneas clear. PERRL.  Sclera anicteric. Throat: lips, mucosa, and tongue normal; without oral thrush or ulcers. Resp: clear to auscultation bilaterally without  rhonchi, wheezes or dullness to percussion. Cardio: regular rate and rhythm, S1, S2 normal, no murmur, click, rub or gallop GI: soft, non-tender; bowel sounds normal; no masses,  no organomegaly Skin: Skin color, texture, turgor normal. No rashes or lesions Lymph nodes: Cervical, supraclavicular, and axillary nodes normal. Neurologic: Grossly normal without any motor, sensory or deep tendon reflexes. Musculoskeletal: No joint deformity or effusion.  CBC    Component Value Date/Time  WBC 7.0 09/05/2019 1551   WBC 6.5 04/28/2016 1509   RBC 4.35 09/05/2019 1551   RBC 5.04 04/28/2016 1509   HGB 13.0 09/05/2019 1551   HCT 37.7 09/05/2019 1551   PLT 319 09/05/2019 1551   MCV 87 09/05/2019 1551   MCH 29.9 09/05/2019 1551   MCH 30.8 04/28/2016 1509   MCHC 34.5 09/05/2019 1551   MCHC 35.1 04/28/2016 1509   RDW 13.1 09/05/2019 1551   LYMPHSABS 3.3 04/04/2015 0632   MONOABS 0.4 04/04/2015 0632   EOSABS 0.1 04/04/2015 0632   BASOSABS 0.0 04/04/2015 0632     Chemistry      Component Value Date/Time   NA 136 09/16/2019 1112   K 4.0 09/16/2019 1112   CL 95 (L) 09/16/2019 1112   CO2 23 09/16/2019 1112   BUN 18 09/16/2019 1112   CREATININE 1.01 09/16/2019 1112   CREATININE 1.18 04/28/2016 1509      Component Value Date/Time   CALCIUM 8.9 09/16/2019 1112   ALKPHOS 2,436 (HH) 09/16/2019 1112   AST 17 09/16/2019 1112   ALT 7 09/16/2019 1112   BILITOT 0.6 09/16/2019 1112     IMPRESSION: 1. Progressive sclerosis of the L4 vertebral body raises concern for skeletal metastatic disease. 2. Possible additional foci of sclerosis involving the left iliac bone, left femoral neck, and L2 and L5 vertebral bodies.   Assessment and Plan:    63 year old man with:  1.  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  He presented with a PSA of 771 and a Gleason score of 4+5 = 9 after a prostate biopsy on October 20, 2019.  He had at least 6 cores  involvement at that time.  He also was found to have elevated alkaline phosphatase indicating diffuse bone disease.  He did have lumbar spine imaging studies which shows progressive sclerosis indicating skeletal metastatic disease.  The natural course of this disease was reviewed today as well as treatment options were discussed.  He understands he has an incurable malignancy and has advanced stage IV disease.  However, treatments are available and should able to treat his disease and palliate his symptoms hopefully for an extended period of time.  The backbone of treating advanced prostate cancer remains androgen deprivation therapy.  Complication associated with this therapy includes fatigue, weight gain, hot flashes among others.  Given his diffuse bone disease I am inclined to proceed with LHRH antagonist and to switch to antagonist in the future.  Therapy escalation utilizing Nicki Reaper of systemic chemotherapy was also reviewed.  It would be a reasonable candidate for Zytiga unless he has diffuse visceral metastasis and chemotherapy would be his best option.  Complication associated with this therapy was also reviewed.  Issues such as fatigue, tiredness, hypertension fluid retention and edema was also reiterated.  After discussion today, he will proceed with androgen deprivation therapy and we will address therapy escalation after completing his staging work-up.  2.  Androgen deprivation: He will start with degarelix in the near future and switch to Eligard after that.  3.  Bone directed therapy: Recommend calcium and vitamin D supplements initially.  He would be a candidate for Xgeva after obtaining dental clearance.  4.  Bone pain: Related to prostate cancer in the bone.  Pain is manageable at this time palliative radiation therapy can be utilized for palliative purposes as needed.  5.  Elevated alkaline phosphatase: This is related to bone etiology and metastatic bone disease.  6.  Prognosis and goals of care: Therapy is palliative at this time although aggressive measures are warranted given his excellent performance status and treatable disease at this time.  He understands though no cure exists for his disease.  7.  Psychosocial considerations: He is inquiring about disability as well as medical coverage is regarding upcoming work-up and evaluation.  We will assist him through this process and prostate cancer navigation will be available to him as well.  8.  Follow-up: Will be in the immediate future after completing his staging work-up.   60  minutes was spent with the patient face-to-face today.  More than 50% of time was spent on reviewing his disease status, reviewing imaging studies, pathology reports as well as addressing future plan of care.       Thank you for the referral. A copy of this consult has been forwarded to the requesting physician.

## 2019-10-30 ENCOUNTER — Telehealth: Payer: Self-pay | Admitting: Medical Oncology

## 2019-10-30 ENCOUNTER — Encounter: Payer: Self-pay | Admitting: Medical Oncology

## 2019-10-30 ENCOUNTER — Telehealth: Payer: Self-pay | Admitting: Oncology

## 2019-10-30 NOTE — Telephone Encounter (Signed)
Scheduled appt per 12/9 los.  Spoke with pt and he is aware of his appt dates and time,

## 2019-11-01 ENCOUNTER — Ambulatory Visit
Admission: RE | Admit: 2019-11-01 | Discharge: 2019-11-01 | Disposition: A | Discharge status: Home / Self Care | Payer: Self-pay | Source: Ambulatory Visit | Attending: Family Medicine | Admitting: Family Medicine

## 2019-11-01 ENCOUNTER — Other Ambulatory Visit: Payer: Self-pay

## 2019-11-01 DIAGNOSIS — H532 Diplopia: Secondary | ICD-10-CM

## 2019-11-01 MED ORDER — GADOBENATE DIMEGLUMINE 529 MG/ML IV SOLN
15.0000 mL | Freq: Once | INTRAVENOUS | Status: AC | PRN
  Administered 2019-11-01: 15 mL via INTRAVENOUS

## 2019-11-03 ENCOUNTER — Telehealth: Payer: Self-pay | Admitting: Family Medicine

## 2019-11-03 DIAGNOSIS — H532 Diplopia: Secondary | ICD-10-CM

## 2019-11-03 NOTE — Telephone Encounter (Signed)
Left a VM letting Michael Stephenson know that his MRI brain is normal limits, which is great news.  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  I would like for him to keep his neurology appointment in January, and I will also refer him to ophthalmology.  I asked him to call our clinic if he has any questions or concerns.

## 2019-11-05 ENCOUNTER — Inpatient Hospital Stay: Payer: Self-pay

## 2019-11-05 ENCOUNTER — Other Ambulatory Visit: Payer: Self-pay

## 2019-11-05 ENCOUNTER — Encounter: Payer: Self-pay | Admitting: Oncology

## 2019-11-05 VITALS — BP 137/83 | HR 86 | Temp 99.1°F | Resp 18

## 2019-11-05 DIAGNOSIS — C61 Malignant neoplasm of prostate: Secondary | ICD-10-CM

## 2019-11-05 LAB — CBC WITH DIFFERENTIAL (CANCER CENTER ONLY)
Abs Immature Granulocytes: 0.13 10*3/uL — ABNORMAL HIGH (ref 0.00–0.07)
Basophils Absolute: 0 10*3/uL (ref 0.0–0.1)
Basophils Relative: 1 %
Eosinophils Absolute: 0 10*3/uL (ref 0.0–0.5)
Eosinophils Relative: 1 %
HCT: 33.2 % — ABNORMAL LOW (ref 39.0–52.0)
Hemoglobin: 10.3 g/dL — ABNORMAL LOW (ref 13.0–17.0)
Immature Granulocytes: 2 %
Lymphocytes Relative: 37 %
Lymphs Abs: 2.3 10*3/uL (ref 0.7–4.0)
MCH: 28.8 pg (ref 26.0–34.0)
MCHC: 31 g/dL (ref 30.0–36.0)
MCV: 92.7 fL (ref 80.0–100.0)
Monocytes Absolute: 0.4 10*3/uL (ref 0.1–1.0)
Monocytes Relative: 6 %
Neutro Abs: 3.3 10*3/uL (ref 1.7–7.7)
Neutrophils Relative %: 53 %
Platelet Count: 219 10*3/uL (ref 150–400)
RBC: 3.58 MIL/uL — ABNORMAL LOW (ref 4.22–5.81)
RDW: 15.9 % — ABNORMAL HIGH (ref 11.5–15.5)
WBC Count: 6.2 10*3/uL (ref 4.0–10.5)
nRBC: 2.7 % — ABNORMAL HIGH (ref 0.0–0.2)

## 2019-11-05 LAB — CMP (CANCER CENTER ONLY)
ALT: 35 U/L (ref 0–44)
AST: 41 U/L (ref 15–41)
Albumin: 3.6 g/dL (ref 3.5–5.0)
Alkaline Phosphatase: 3542 U/L — ABNORMAL HIGH (ref 38–126)
Anion gap: 12 (ref 5–15)
BUN: 16 mg/dL (ref 8–23)
CO2: 25 mmol/L (ref 22–32)
Calcium: 8.9 mg/dL (ref 8.9–10.3)
Chloride: 104 mmol/L (ref 98–111)
Creatinine: 1.12 mg/dL (ref 0.61–1.24)
GFR, Est AFR Am: >60 mL/min (ref >60)
GFR, Estimated: >60 mL/min (ref >60)
Glucose, Bld: 152 mg/dL — ABNORMAL HIGH (ref 70–99)
Potassium: 3.6 mmol/L (ref 3.5–5.1)
Sodium: 141 mmol/L (ref 135–145)
Total Bilirubin: 0.7 mg/dL (ref 0.3–1.2)
Total Protein: 7.3 g/dL (ref 6.5–8.1)

## 2019-11-05 MED ORDER — DEGARELIX ACETATE(240 MG DOSE) 120 MG/VIAL ~~LOC~~ SOLR
240.0000 mg | Freq: Once | SUBCUTANEOUS | Status: AC
  Administered 2019-11-05: 240 mg via SUBCUTANEOUS
  Filled 2019-11-05: qty 6

## 2019-11-05 NOTE — Patient Instructions (Signed)
Degarelix injection What is this medicine? DEGARELIX (deg a REL ix) is used to treat men with advanced prostate cancer. This medicine may be used for other purposes; ask your health care provider or pharmacist if you have questions. COMMON BRAND NAME(S): Degarelix, Firmagon What should I tell my health care provider before I take this medicine? They need to know if you have any of these conditions:  diabetes  heart disease  kidney disease  liver disease  low levels of potassium or magnesium in the blood  osteoporosis  an unusual or allergic reaction to degarelix, mannitol, other medicines, foods, dyes, or preservatives  pregnant or trying to get pregnant  breast-feeding How should I use this medicine? This medicine is for injection under the skin. It is usually given by a health care professional in a hospital or clinic setting. If you get this medicine at home, you will be taught how to prepare and give this medicine. Use exactly as directed. Take your medicine at regular intervals. Do not take it more often than directed. It is important that you put your used needles and syringes in a special sharps container. Do not put them in a trash can. If you do not have a sharps container, call your pharmacist or healthcare provider to get one. Talk to your pediatrician regarding the use of this medicine in children. Special care may be needed. Overdosage: If you think you have taken too much of this medicine contact a poison control center or emergency room at once. NOTE: This medicine is only for you. Do not share this medicine with others. What if I miss a dose? Try not to miss a dose. If you do miss a dose, call your doctor or health care professional for advice. What may interact with this medicine? Do not take this medicine with any of the following medications:  amiodarone  bretylium  disopyramide  droperidol  ibutilide  procainamide  quinidine  sotalol This  medicine may also interact with the following medications:  dofetilide This list may not describe all possible interactions. Give your health care provider a list of all the medicines, herbs, non-prescription drugs, or dietary supplements you use. Also tell them if you smoke, drink alcohol, or use illegal drugs. Some items may interact with your medicine. What should I watch for while using this medicine? Visit your doctor or health care professional for regular checks on your progress and discuss any issues before you start taking this medicine. Do not rub or scratch injection site. There may be a lump at the injection site, or it may be red or sore for a few days after your dose. Your doctor or health care professional will need to monitor your hormone levels in your blood to check your response to treatment. Try to keep any appointments for testing. What side effects may I notice from receiving this medicine? Side effects that you should report to your doctor or health care professional as soon as possible:  allergic reactions like skin rash, itching or hives, swelling of the face, lips, or tongue  fever or chills  irregular heartbeat  nausea and vomiting along with severe abdominal pain  pain or difficulty passing urine  pelvic pain or bloating  signs and symptoms of high blood sugar such as being more thirsty or hungry or having to urinate more than normal. You may also feel very tired or have blurry vision Side effects that usually do not require medical attention (report to your doctor or health   care professional if they continue or are bothersome):  change in sex drive or performance  constipation  headache  high blood pressure  hot flashes (flushing of skin, increased sweating)  itching, redness or mild pain at site where injected  joint pain  trouble sleeping  unusually weak or tired  weight gain This list may not describe all possible side effects. Call your  doctor for medical advice about side effects. You may report side effects to FDA at 1-800-FDA-1088. Where should I keep my medicine? Keep out of the reach of children. This drug is usually given in a hospital or clinic and will not be stored at home. In rare cases, this medicine may be given at home. If you are using this medicine at home, you will be instructed on how to store this medicine. Throw away any unused medicine after the expiration date on the label. NOTE: This sheet is a summary. It may not cover all possible information. If you have questions about this medicine, talk to your doctor, pharmacist, or health care provider.  2020 Elsevier/Gold Standard (2019-01-20 12:20:52)  

## 2019-11-05 NOTE — Progress Notes (Signed)
Met with patient at registration to introduce myself as Financial Resource Specialist and to offer available resources. ° °Discussed one-time $700 [facility] grant and qualifications to assist with personal expenses while going through treatment. ° °Gave him my card if interested in applying and for any additional financial questions or concerns. °

## 2019-11-06 ENCOUNTER — Encounter (HOSPITAL_COMMUNITY)
Admission: RE | Admit: 2019-11-06 | Discharge: 2019-11-06 | Disposition: A | Discharge status: Home / Self Care | Payer: Self-pay | Source: Ambulatory Visit | Attending: Oncology | Admitting: Oncology

## 2019-11-06 ENCOUNTER — Ambulatory Visit (HOSPITAL_COMMUNITY)
Admission: RE | Admit: 2019-11-06 | Discharge: 2019-11-06 | Disposition: A | Discharge status: Home / Self Care | Payer: Self-pay | Source: Ambulatory Visit | Attending: Oncology | Admitting: Oncology

## 2019-11-06 ENCOUNTER — Encounter: Payer: Self-pay | Admitting: Medical Oncology

## 2019-11-06 ENCOUNTER — Telehealth: Payer: Self-pay | Admitting: Medical Oncology

## 2019-11-06 DIAGNOSIS — C61 Malignant neoplasm of prostate: Secondary | ICD-10-CM

## 2019-11-06 LAB — PROSTATE-SPECIFIC AG, SERUM (LABCORP): Prostate Specific Ag, Serum: 1383 ng/mL — ABNORMAL HIGH (ref 0.0–4.0)

## 2019-11-06 MED ORDER — SODIUM CHLORIDE (PF) 0.9 % IJ SOLN
INTRAMUSCULAR | Status: AC
  Filled 2019-11-06: qty 50

## 2019-11-06 MED ORDER — TECHNETIUM TC 99M MEDRONATE IV KIT
20.3000 | PACK | Freq: Once | INTRAVENOUS | Status: AC | PRN
  Administered 2019-11-06: 20.3 via INTRAVENOUS

## 2019-11-06 MED ORDER — IOHEXOL 300 MG/ML  SOLN: 100.0000 mL | Freq: Once | INTRAMUSCULAR | Status: DC | PRN

## 2019-11-06 MED ORDER — IOHEXOL 300 MG/ML  SOLN
100.0000 mL | Freq: Once | INTRAMUSCULAR | Status: AC | PRN
  Administered 2019-11-06: 100 mL via INTRAVENOUS

## 2019-11-06 NOTE — Telephone Encounter (Signed)
Left a message introducing myself as the prostate nurse navigator and asking for a return call,to follow up after  consult with Dr. Alen Blew 10/29/19.

## 2019-11-06 NOTE — Telephone Encounter (Signed)
Left message requesting a return call. He was in the office yesterday for ADT and asked to see me. I was out of the office, so trying to connect with him.

## 2019-11-11 ENCOUNTER — Telehealth: Payer: Self-pay | Admitting: Pharmacist

## 2019-11-11 ENCOUNTER — Other Ambulatory Visit: Payer: Self-pay

## 2019-11-11 ENCOUNTER — Encounter: Payer: Self-pay | Admitting: Medical Oncology

## 2019-11-11 ENCOUNTER — Inpatient Hospital Stay (HOSPITAL_BASED_OUTPATIENT_CLINIC_OR_DEPARTMENT_OTHER): Payer: Self-pay | Admitting: Oncology

## 2019-11-11 VITALS — BP 147/80 | HR 78 | Temp 97.8°F | Resp 18 | Wt 174.4 lb

## 2019-11-11 DIAGNOSIS — C61 Malignant neoplasm of prostate: Secondary | ICD-10-CM

## 2019-11-11 MED ORDER — CALCIUM CARBONATE-VITAMIN D 500-200 MG-UNIT PO TABS: 1.0000 | ORAL_TABLET | Freq: Two times a day (BID) | ORAL | 3 refills | Status: AC

## 2019-11-11 MED ORDER — OXYCODONE HCL 5 MG PO TABS: 5.0000 mg | ORAL_TABLET | ORAL | 0 refills | Status: AC | PRN

## 2019-11-11 MED ORDER — ABIRATERONE ACETATE 250 MG PO TABS: 1000.0000 mg | ORAL_TABLET | Freq: Every day | ORAL | 0 refills | Status: DC

## 2019-11-11 NOTE — Progress Notes (Signed)
Hematology and Oncology Follow Up Visit  Michael Stephenson [id number] 12-26-1955 63 y.o. 11/11/2019 9:44 AM Autry-Lott, Breck Coons, Naaman Plummer, DO   Principle Diagnosis: 63 year old man with castration-sensitive advanced prostate cancer diagnosed in November 2020.  He presented with a PSA of 771, Gleason score 4+5 = 9 and bone disease.  Prior Therapy:  He is status post prostate biopsy completed on October 20, 2019.  Current therapy:  Androgen deprivation therapy started in December 2020.  He received degarelix 240 mg at that time.    He is under consideration to start additional therapy.  Interim History: Michael Stephenson returns today for repeat evaluation.  Since the last visit, he started the androgen deprivation therapy without any major complications.  He denies any fatigue, nausea or hot flashes at this time.  He does report slight decrease in his bone pain although still has it especially at night times.  He is eating better and has gained more weight.  He is ambulating without any recent falls or syncope.  Normal status and quality of life remains excellent.   Patient denied any alteration mental status, neuropathy, confusion or dizziness.  Denies any headaches or lethargy.  Denies any night sweats, weight loss or changes in appetite.  Denied orthopnea, dyspnea on exertion or chest discomfort.  Denies shortness of breath, difficulty breathing hemoptysis or cough.  Denies any abdominal distention, nausea, early satiety or dyspepsia.  Denies any hematuria, frequency, dysuria or nocturia.  Denies any skin irritation, dryness or rash.  Denies any ecchymosis or petechiae.  Denies any lymphadenopathy or clotting.  Denies any heat or cold intolerance.  Denies any anxiety or depression.  Remaining review of system is negative.       Medications: I have reviewed the patient's current medications.  Current Outpatient Medications  Medication Sig Dispense Refill  . amLODipine (NORVASC) 5 MG  tablet Take 1 tablet (5 mg total) by mouth at bedtime. 30 tablet 0  . blood glucose meter kit and supplies Dispense based on patient and insurance preference. Use up to four times daily as directed. (FOR ICD-9 250.00, 250.01). 1 each 0  . gabapentin (NEURONTIN) 100 MG capsule Take 1 capsule (100 mg total) by mouth 3 (three) times daily. 90 capsule 3  . metFORMIN (GLUCOPHAGE) 1000 MG tablet Take 1 tablet (1,000 mg total) by mouth 2 (two) times daily with a meal. 60 tablet 2  . metFORMIN (GLUCOPHAGE-XR) 500 MG 24 hr tablet Take 2 tablets (1,000 mg total) by mouth daily with breakfast. 180 tablet 3  . sildenafil (VIAGRA) 100 MG tablet Take 0.5-1 tablets (50-100 mg total) by mouth daily as needed for erectile dysfunction. 10 tablet 0  . tadalafil (ADCIRCA/CIALIS) 20 MG tablet Take 0.5-1 tablets (10-20 mg total) by mouth every other day as needed for erectile dysfunction. 5 tablet 11   No current facility-administered medications for this visit.     Allergies: No Known Allergies  Past Medical History, Surgical history, Social history, and Family History were reviewed and updated.    Physical Exam: Blood pressure (!) 147/80, pulse 78, temperature 97.8 F (36.6 C), temperature source Temporal, resp. rate 18, weight 174 lb 6.4 oz (79.1 kg), SpO2 100 %.  ECOG: 1    General appearance: Comfortable appearing without any discomfort Head: Normocephalic without any trauma Oropharynx: Mucous membranes are moist and pink without any thrush or ulcers. Eyes: Pupils are equal and round reactive to light. Lymph nodes: No cervical, supraclavicular, inguinal or axillary lymphadenopathy.   Heart:regular rate and rhythm.  S1 and S2 without leg edema. Lung: Clear without any rhonchi or wheezes.  No dullness to percussion. Abdomin: Soft, nontender, nondistended with good bowel sounds.  No hepatosplenomegaly. Musculoskeletal: No joint deformity or effusion.  Full range of motion noted. Neurological: No  deficits noted on motor, sensory and deep tendon reflex exam. Skin: No petechial rash or dryness.  Appeared moist.     Lab Results: Lab Results  Component Value Date   WBC 6.2 11/05/2019   HGB 10.3 (L) 11/05/2019   HCT 33.2 (L) 11/05/2019   MCV 92.7 11/05/2019   PLT 219 11/05/2019     Chemistry      Component Value Date/Time   NA 141 11/05/2019 1139   NA 136 09/16/2019 1112   K 3.6 11/05/2019 1139   CL 104 11/05/2019 1139   CO2 25 11/05/2019 1139   BUN 16 11/05/2019 1139   BUN 18 09/16/2019 1112   CREATININE 1.12 11/05/2019 1139   CREATININE 1.18 04/28/2016 1509      Component Value Date/Time   CALCIUM 8.9 11/05/2019 1139   ALKPHOS 3,542 (H) 11/05/2019 1139   AST 41 11/05/2019 1139   ALT 35 11/05/2019 1139   BILITOT 0.7 11/05/2019 1139     IMPRESSION: 1. Retroperitoneal nodes which although not technically pathologic by size criteria are highly suspicious, based on location and interval development since 2013. 2. Diffuse sclerotic osseous metastasis. 3.  Aortic Atherosclerosis (ICD10-I70.0).  IMPRESSION: Widespread osseous metastatic disease with super scan appearance, with numerous metastatic lesions seen throughout the axial and appendicular skeleton as above.   Impression and Plan:   63 year old man with:  1.    Castration-sensitive advanced prostate cancer with disease to the bone documented in November 2020.  He has a Gleason score of 9 and PSA of 771 at the time of diagnosis.  The natural course of this disease was updated again and imaging studies including CT scan and bone scan were reviewed.  He has diffuse bony metastasis at this time without any visceral metastasis.  Risks and benefits of therapy escalation was reviewed today.  Potential complication associated with Zytiga versus Xtandi versus systemic chemotherapy were reviewed.  At this time I recommended proceeding with Zytiga 1000 mg daily.  Complications including hypertension, edema,  excessive fatigue and tiredness was reviewed.  Given his diabetes we will hold off on prednisone for the time being.  He is agreeable to proceed at this time.   2.  Androgen deprivation: He is status post degarelix received in December and will proceed with Eligard in January 2021.  3.  Bone directed therapy: Prescription for calcium and vitamin D supplements were made available to him.  I will also send him to dental evaluation prior to [facility].  4.  Bone pain: Prescription for oxycodone acetaminophen will be available to him.  His pain is better at this time since the treatment of his cancer.  5.  Prognosis and goals of care: His disease is incurable although aggressive measures are warranted given his excellent performance status and treatable disease.  6.  Follow-up: In 4 weeks for repeat evaluation.  25  minutes was spent with the patient face-to-face today.  More than 50% of time was spent on reviewing his disease status, reviewing laboratory data, treatment options and complications related to therapy.   [name], MD 12/22/20209:44 AM

## 2019-11-11 NOTE — Telephone Encounter (Signed)
Oral Oncology Pharmacist Encounter  Received new prescription for Zytiga (abiraterone) for the treatment of metastatic prostate cancer, castrate sensitive in conjunction with ADT, planned duration until disease progression or unacceptable drug toxicity. With  His diabetes MD is holding off on prednisone for the time being.   CMP from 11/05/2019 assessed, no relevant lab abnormalities. BP from 11/11/2019 reviewed. BP slightly elevated continue to monitor for the need to optimize current HTN medication regimen. Prescription dose and frequency assessed.   Current medication list in Epic reviewed, no DDIs with abiraterone identified.  Prescription has been e-scribed to the [facility] for benefits analysis and approval.  Oral Oncology Clinic will continue to follow for insurance authorization, copayment issues, initial counseling and start date.  Darl Pikes, PharmD, BCPS, [facility] Hematology/Oncology Clinical Pharmacist [facility] [place] [facility] [phone]  11/11/2019 10:47 AM

## 2019-11-12 ENCOUNTER — Telehealth: Payer: Self-pay | Admitting: Oncology

## 2019-11-12 NOTE — Telephone Encounter (Signed)
Scheduled appt per 12/22 los.  Left a vm of the appt date and time,

## 2019-11-17 ENCOUNTER — Telehealth: Payer: Self-pay

## 2019-11-17 NOTE — Telephone Encounter (Signed)
Left a VM for patient on 12/22,12/23, and 11/17/19 to ask if patient has any prescription insurance.  And if no insurance, to get household number and income to apply for manufacturer assistance for [employer]  [place] Patient [place] Phone [phone] Fax [phone] 11/17/2019 12:17 PM

## 2019-11-18 ENCOUNTER — Telehealth: Payer: Self-pay | Admitting: Medical Oncology

## 2019-11-18 ENCOUNTER — Encounter: Payer: Self-pay | Admitting: *Deleted

## 2019-11-18 NOTE — Progress Notes (Signed)
[place] Work  Clinical Social Work was referred by prostate navigator for assessment of psychosocial needs.  Clinical Social Worker atttempted contacted patient by phone  to offer support and assess for needs.  CSW left voicemail to return call.   Gwinda Maine, LCSW  Clinical Social Worker [facility]

## 2019-11-18 NOTE — Telephone Encounter (Signed)
Left a message requesting a return call to discuss drug insurance coverage. Michael Stephenson has attempted to reach patient to discuss drug coverage, without a return call. I stressed the importance of getting this information so we can help him get the Zytiga to treat his cancer. I asked him to call me or Benjamine Mola.

## 2019-11-26 ENCOUNTER — Other Ambulatory Visit (HOSPITAL_COMMUNITY): Payer: Self-pay | Admitting: Dentistry

## 2019-11-27 ENCOUNTER — Encounter: Payer: Self-pay | Admitting: Pharmacy Technician

## 2019-12-01 ENCOUNTER — Telehealth: Payer: Self-pay | Admitting: Medical Oncology

## 2019-12-01 NOTE — Telephone Encounter (Signed)
Spoke with patient to follow up post  consult with Dr. Alen Blew and to discuss Zytiga. I informed him Benjamine Mola,  has attempted to reach him numerous times regarding drug coverage and patient assistance for Zytiga. He states he was not aware and that he does not have drug coverage. I gave him Elizabeth's contact information and encouraged him to call her. I stressed the importance of taking Zytiga to control his cancer. I asked if he is aware of injection appointment,  1/13 and he confirmed. I asked how he is doing and coping and he states ok. I asked if he has spoken with Retail banker and he was ale to connect with her this morning. I stressed, we are here for him and to support him on this journey and to please call us with questions or concerns. I again, stressed the importance of speaking with Benjamine Mola regarding Zytiga. He states he will call her today.

## 2019-12-03 ENCOUNTER — Other Ambulatory Visit: Payer: Self-pay

## 2019-12-03 ENCOUNTER — Inpatient Hospital Stay: Payer: BLUE CROSS/BLUE SHIELD | Attending: Oncology

## 2019-12-03 ENCOUNTER — Telehealth: Payer: Self-pay

## 2019-12-03 VITALS — BP 138/72 | HR 77 | Temp 98.2°F | Resp 18

## 2019-12-03 DIAGNOSIS — C61 Malignant neoplasm of prostate: Secondary | ICD-10-CM | POA: Diagnosis present

## 2019-12-03 DIAGNOSIS — Z5111 Encounter for antineoplastic chemotherapy: Secondary | ICD-10-CM | POA: Insufficient documentation

## 2019-12-03 DIAGNOSIS — C7951 Secondary malignant neoplasm of bone: Secondary | ICD-10-CM | POA: Diagnosis not present

## 2019-12-03 MED ORDER — DEGARELIX ACETATE 80 MG ~~LOC~~ SOLR
80.0000 mg | Freq: Once | SUBCUTANEOUS | Status: AC
  Administered 2019-12-03: 80 mg via SUBCUTANEOUS
  Filled 2019-12-03: qty 4

## 2019-12-03 NOTE — Telephone Encounter (Signed)
Oral Oncology Pharmacist Encounter  [place] will deliver Zytiga to [name] by 1/15. [name] will get started when he receives the medication.  Patient education Received new prescription for Zytiga (abiraterone) for the treatment of metastatic prostate cancer, castrate sensitive in conjunction with ADT, planned duration until disease progression or unacceptable drug toxicity.  Pt is doing well. Counseled patient on administration, dosing, side effects, monitoring, drug-food interactions, safe handling, storage, and disposal. Patient will take 4 tablets (1,000 mg total) by mouth daily. Take on an empty stomach 1 hour before or 2 hours after a meal.  Side effects include but not limited to: HTN,, decreased wbc, hot flashes.    [name] stated he was out of his blood pressure medication and plan on calling the doctor's office that prescribe the medication for a refill. Per Epic the amlodipine was prescribed by a provider at [place]. I provided him with the number to their office. Reviewed the importance of BP control while on abiraterone.  Reviewed with patient importance of keeping a medication schedule and plan for any missed doses.  [name] voiced understanding and appreciation. All questions answered.  Provided patient with [facility] phone number. Patient knows to call the office with questions or concerns. Oral Chemotherapy Navigation Clinic will continue to follow.  Darl Pikes, PharmD, BCPS, [facility] Hematology/Oncology Clinical Pharmacist [facility] [place] [facility] [phone]  12/03/2019 3:38 PM

## 2019-12-03 NOTE — Telephone Encounter (Signed)
Oral Oncology Patient Advocate Encounter  Received notification from [facility] that prior authorization for Michael Stephenson is required.  PA submitted on CoverMyMeds Key [id number] Status is pending  Oral Oncology Clinic will continue to follow.  [place] Patient [place] Phone [phone] Fax [phone] 12/03/2019 11:35 AM

## 2019-12-03 NOTE — Telephone Encounter (Signed)
Oral Oncology Patient Advocate Encounter  Prior Authorization for Michael Stephenson has been approved.    PA# [id number] Effective dates: 12/03/19 through 12/01/20  Patients co-pay is $607.42  Oral Oncology Clinic will continue to follow.   [place] Patient [place] Phone [phone] Fax [phone] 12/03/2019 2:09 PM

## 2019-12-03 NOTE — Patient Instructions (Signed)
Degarelix injection What is this medicine? DEGARELIX (deg a REL ix) is used to treat men with advanced prostate cancer. This medicine may be used for other purposes; ask your health care provider or pharmacist if you have questions. COMMON BRAND NAME(S): Degarelix, Firmagon What should I tell my health care provider before I take this medicine? They need to know if you have any of these conditions:  diabetes  heart disease  kidney disease  liver disease  low levels of potassium or magnesium in the blood  osteoporosis  an unusual or allergic reaction to degarelix, mannitol, other medicines, foods, dyes, or preservatives  pregnant or trying to get pregnant  breast-feeding How should I use this medicine? This medicine is for injection under the skin. It is usually given by a health care professional in a hospital or clinic setting. If you get this medicine at home, you will be taught how to prepare and give this medicine. Use exactly as directed. Take your medicine at regular intervals. Do not take it more often than directed. It is important that you put your used needles and syringes in a special sharps container. Do not put them in a trash can. If you do not have a sharps container, call your pharmacist or healthcare provider to get one. Talk to your pediatrician regarding the use of this medicine in children. Special care may be needed. Overdosage: If you think you have taken too much of this medicine contact a poison control center or emergency room at once. NOTE: This medicine is only for you. Do not share this medicine with others. What if I miss a dose? Try not to miss a dose. If you do miss a dose, call your doctor or health care professional for advice. What may interact with this medicine? Do not take this medicine with any of the following medications:  amiodarone  bretylium  disopyramide  droperidol  ibutilide  procainamide  quinidine  sotalol This  medicine may also interact with the following medications:  dofetilide This list may not describe all possible interactions. Give your health care provider a list of all the medicines, herbs, non-prescription drugs, or dietary supplements you use. Also tell them if you smoke, drink alcohol, or use illegal drugs. Some items may interact with your medicine. What should I watch for while using this medicine? Visit your doctor or health care professional for regular checks on your progress and discuss any issues before you start taking this medicine. Do not rub or scratch injection site. There may be a lump at the injection site, or it may be red or sore for a few days after your dose. Your doctor or health care professional will need to monitor your hormone levels in your blood to check your response to treatment. Try to keep any appointments for testing. What side effects may I notice from receiving this medicine? Side effects that you should report to your doctor or health care professional as soon as possible:  allergic reactions like skin rash, itching or hives, swelling of the face, lips, or tongue  fever or chills  irregular heartbeat  nausea and vomiting along with severe abdominal pain  pain or difficulty passing urine  pelvic pain or bloating  signs and symptoms of high blood sugar such as being more thirsty or hungry or having to urinate more than normal. You may also feel very tired or have blurry vision Side effects that usually do not require medical attention (report to your doctor or health   care professional if they continue or are bothersome):  change in sex drive or performance  constipation  headache  high blood pressure  hot flashes (flushing of skin, increased sweating)  itching, redness or mild pain at site where injected  joint pain  trouble sleeping  unusually weak or tired  weight gain This list may not describe all possible side effects. Call your  doctor for medical advice about side effects. You may report side effects to FDA at 1-800-FDA-1088. Where should I keep my medicine? Keep out of the reach of children. This drug is usually given in a hospital or clinic and will not be stored at home. In rare cases, this medicine may be given at home. If you are using this medicine at home, you will be instructed on how to store this medicine. Throw away any unused medicine after the expiration date on the label. NOTE: This sheet is a summary. It may not cover all possible information. If you have questions about this medicine, talk to your doctor, pharmacist, or health care provider.  2020 Elsevier/Gold Standard (2019-01-20 12:20:52)  

## 2019-12-03 NOTE — Telephone Encounter (Signed)
Obtained copay card for [employer] brand Abiraterone 250mg  tablets.  This brings Stephenson copay to $5  Bin [id number] ID [id number] Group [id number]  Michael Stephenson [place] Phone [phone] Fax [phone] 12/03/2019 2:51 PM

## 2019-12-04 MED FILL — ABIRATERONE ACETATE 250 MG: 250 | 15 days supply | Qty: 60 | Fill #0

## 2019-12-08 ENCOUNTER — Other Ambulatory Visit (HOSPITAL_COMMUNITY): Payer: BLUE CROSS/BLUE SHIELD | Admitting: Dentistry

## 2019-12-09 ENCOUNTER — Other Ambulatory Visit: Payer: Self-pay

## 2019-12-09 ENCOUNTER — Ambulatory Visit (HOSPITAL_COMMUNITY): Payer: Self-pay | Admitting: Dentistry

## 2019-12-09 ENCOUNTER — Encounter (HOSPITAL_COMMUNITY): Payer: Self-pay | Admitting: Dentistry

## 2019-12-09 VITALS — BP 158/74 | HR 89 | Temp 98.3°F

## 2019-12-09 DIAGNOSIS — M264 Malocclusion, unspecified: Secondary | ICD-10-CM

## 2019-12-09 DIAGNOSIS — C61 Malignant neoplasm of prostate: Secondary | ICD-10-CM

## 2019-12-09 DIAGNOSIS — M270 Developmental disorders of jaws: Secondary | ICD-10-CM

## 2019-12-09 DIAGNOSIS — Z98811 Dental restoration status: Secondary | ICD-10-CM

## 2019-12-09 DIAGNOSIS — K08409 Partial loss of teeth, unspecified cause, unspecified class: Secondary | ICD-10-CM

## 2019-12-09 DIAGNOSIS — K0889 Other specified disorders of teeth and supporting structures: Secondary | ICD-10-CM

## 2019-12-09 DIAGNOSIS — K045 Chronic apical periodontitis: Secondary | ICD-10-CM

## 2019-12-09 DIAGNOSIS — M2632 Excessive spacing of fully erupted teeth: Secondary | ICD-10-CM

## 2019-12-09 DIAGNOSIS — K036 Deposits [accretions] on teeth: Secondary | ICD-10-CM

## 2019-12-09 DIAGNOSIS — K0530 Chronic periodontitis, unspecified: Secondary | ICD-10-CM

## 2019-12-09 DIAGNOSIS — K0510 Chronic gingivitis, plaque induced: Secondary | ICD-10-CM

## 2019-12-09 DIAGNOSIS — C7951 Secondary malignant neoplasm of bone: Secondary | ICD-10-CM

## 2019-12-09 DIAGNOSIS — Z01818 Encounter for other preprocedural examination: Secondary | ICD-10-CM

## 2019-12-09 DIAGNOSIS — K06010 Localized gingival recession, unspecified: Secondary | ICD-10-CM

## 2019-12-09 DIAGNOSIS — K029 Dental caries, unspecified: Secondary | ICD-10-CM

## 2019-12-09 NOTE — Patient Instructions (Signed)
COVID-19 Education: The signs and symptoms of COVID-19 were discussed with the patient and how to seek care for testing (follow up with PCP or arrange E-visit).   The importance of social distancing was discussed today.  COVID-19 Vaccine Information can be found at: [email or web address].uk For questions related to vaccine distribution or appointments, please email [email or web address] or call ([phone].     Patient is to think about his treatment options and call dental medicine with his decision. Lenn Cal, DDS

## 2019-12-09 NOTE — Progress Notes (Signed)
DENTAL CONSULTATION  Date of Consultation:  12/09/2019 Patient Name:   Michael Stephenson Date of Birth:   [birth year] Medical Record Number: [id number]  COVID 19 SCREENING: The patient does not symptoms concerning for COVID-19 infection (Including fever, chills, cough, or new SHORTNESS OF BREATH).    VITALS: BP (!) 158/74 (BP Location: Right Arm)   Pulse 89   Temp 98.3 F (36.8 C)   CHIEF COMPLAINT: Patient referred by Dr. Alen Blew for dental consultation.  HPI: Michael Stephenson is a 64 year old male recently diagnosed with prostate cancer with bone metastases.  Patient with anticipated Xgeva therapy.  Patient is now seen as part of a medically necessary pre-Xgeva therapy dental protocol examination.  The patient currently denies acute toothaches, swellings, or abscesses.  Patient was last seen 4 to 5 years ago to have a tooth #29 pulled.  This was at [facility].  There were no complications.  Patient did not have his lower partial denture repaired at that time to replace tooth #29.  Prior to that, the patient has been [facility] to have multiple teeth extracted and an upper complete and lower acrylic partial denture fabricated.  Patient indicates that the upper complete denture "fits okay" but is "loose".  The lower acrylic partial denture has a broken left distal extension and tooth #29 has not been added to the partial denture after it was extracted.  The patient still uses the upper complete denture and lower acrylic partial denture daily.  The patient denies having dental phobia.  PROBLEM LIST: Patient Active Problem List   Diagnosis Date Noted  . Prostate cancer ([place]) 10/29/2019    Priority: High  . Diplopia 10/10/2019  . Loss of weight 09/24/2019  . Elevated PSA 09/24/2019  . Screening for viral disease 09/24/2019  . Elevated alkaline phosphatase level 09/16/2019  . Black stool 09/05/2019  . Hip pain 09/05/2019  . Libido, decreased 03/27/2019  . Numbness and tingling  of left lower extremity 03/12/2019  . Uncontrolled type 2 diabetes mellitus without complication, without long-term current use of insulin 01/02/2019  . Primary hypertension 01/02/2019  . Palpitations 01/02/2019  . Encounter for annual physical exam 01/02/2019  . Type 2 diabetes mellitus with complication, without long-term current use of insulin ([place]) 04/28/2016  . Erectile dysfunction 04/28/2016  . Other specified transient cerebral ischemias   . Paresthesias   . Diabetes mellitus, new onset ([place]) 04/03/2015  . TIA (transient ischemic attack) 04/03/2015  . Postop check 04/16/2012  . Hypertension 04/16/2012    PMH: Past Medical History:  Diagnosis Date  . Anxiety   . DM (diabetes mellitus) ([place])   . Heart murmur   . HLD (hyperlipidemia)   . Hypertension     PSH: Past Surgical History:  Procedure Laterality Date  . LAPAROSCOPIC APPENDECTOMY  03/30/2012   Procedure: APPENDECTOMY LAPAROSCOPIC;  Surgeon: Gwenyth Ober, MD;  Location: [place];  Service: General;  Laterality: N/A;  . TONSILLECTOMY      ALLERGIES: No Known Allergies  MEDICATIONS: Current Outpatient Medications  Medication Sig Dispense Refill  . abiraterone acetate (ZYTIGA) 250 MG tablet Take 4 tablets (1,000 mg total) by mouth daily. Take on an empty stomach 1 hour before or 2 hours after a meal 120 tablet 0  . amLODipine (NORVASC) 5 MG tablet Take 1 tablet (5 mg total) by mouth at bedtime. 30 tablet 0  . blood glucose meter kit and supplies Dispense based on patient and insurance preference. Use up to four times daily as  directed. (FOR ICD-9 250.00, 250.01). 1 each 0  . calcium-vitamin D (OSCAL WITH D) 500-200 MG-UNIT tablet Take 1 tablet by mouth 2 (two) times daily. 120 tablet 3  . gabapentin (NEURONTIN) 100 MG capsule Take 1 capsule (100 mg total) by mouth 3 (three) times daily. 90 capsule 3  . metFORMIN (GLUCOPHAGE-XR) 500 MG 24 hr tablet Take 2 tablets (1,000 mg total) by mouth daily with breakfast. 180  tablet 3  . oxyCODONE (OXY IR/ROXICODONE) 5 MG immediate release tablet Take 1 tablet (5 mg total) by mouth every 4 (four) hours as needed for severe pain. 30 tablet 0  . metFORMIN (GLUCOPHAGE) 1000 MG tablet Take 1 tablet (1,000 mg total) by mouth 2 (two) times daily with a meal. (Patient not taking: Reported on 12/09/2019) 60 tablet 2  . sildenafil (VIAGRA) 100 MG tablet Take 0.5-1 tablets (50-100 mg total) by mouth daily as needed for erectile dysfunction. (Patient not taking: Reported on 12/09/2019) 10 tablet 0  . tadalafil (ADCIRCA/CIALIS) 20 MG tablet Take 0.5-1 tablets (10-20 mg total) by mouth every other day as needed for erectile dysfunction. (Patient not taking: Reported on 12/09/2019) 5 tablet 11   No current facility-administered medications for this visit.    LABS: Lab Results  Component Value Date   WBC 6.2 11/05/2019   HGB 10.3 (L) 11/05/2019   HCT 33.2 (L) 11/05/2019   MCV 92.7 11/05/2019   PLT 219 11/05/2019      Component Value Date/Time   NA 141 11/05/2019 1139   NA 136 09/16/2019 1112   K 3.6 11/05/2019 1139   CL 104 11/05/2019 1139   CO2 25 11/05/2019 1139   GLUCOSE 152 (H) 11/05/2019 1139   BUN 16 11/05/2019 1139   BUN 18 09/16/2019 1112   CREATININE 1.12 11/05/2019 1139   CREATININE 1.18 04/28/2016 1509   CALCIUM 8.9 11/05/2019 1139   GFRNONAA >60 11/05/2019 1139   GFRNONAA 68 04/28/2016 1509   GFRAA >60 11/05/2019 1139   GFRAA 78 04/28/2016 1509   Lab Results  Component Value Date   INR 1.0 09/16/2019   INR 1.02 04/03/2015   No results found for: PTT  SOCIAL HISTORY: Social History   Socioeconomic History  . Marital status: Married    Spouse name: Not on file  . Number of children: 4  . Years of education: Not on file  . Highest education level: Not on file  Occupational History  . Not on file  Tobacco Use  . Smoking status: Former Smoker    Types: Cigarettes    Quit date: 1985    Years since quitting: 36.0  . Smokeless tobacco: Never  Used  Substance and Sexual Activity  . Alcohol use: No  . Drug use: Yes    Types: Marijuana  . Sexual activity: Not on file  Other Topics Concern  . Not on file  Social History Narrative  . Not on file   Social Determinants of Health   Financial Resource Strain:   . Difficulty of Paying Living Expenses: Not on file  Food Insecurity:   . Worried About Charity fundraiser in the Last Year: Not on file  . Ran Out of Food in the Last Year: Not on file  Transportation Needs:   . Lack of Transportation (Medical): Not on file  . Lack of Transportation (Non-Medical): Not on file  Physical Activity:   . Days of Exercise per Week: Not on file  . Minutes of Exercise per Session: Not on file  Stress:   . Feeling of Stress : Not on file  Social Connections:   . Frequency of Communication with Friends and Family: Not on file  . Frequency of Social Gatherings with Friends and Family: Not on file  . Attends Religious Services: Not on file  . Active Member of Clubs or Organizations: Not on file  . Attends Archivist Meetings: Not on file  . Marital Status: Not on file  Intimate Partner Violence:   . Fear of Current or Ex-Partner: Not on file  . Emotionally Abused: Not on file  . Physically Abused: Not on file  . Sexually Abused: Not on file    FAMILY HISTORY: Family History  Problem Relation Age of Onset  . Benign prostatic hyperplasia Father   . Heart disease Mother   . Kidney disease Mother   . Diabetes Sister   . Stroke Brother   . Colon cancer Neg Hx   . Esophageal cancer Neg Hx   . Stomach cancer Neg Hx   . Rectal cancer Neg Hx     REVIEW OF SYSTEMS: Reviewed with the patient as per History of present illness. Psych: Patient denies having dental phobia.  DENTAL HISTORY: CHIEF COMPLAINT: Patient referred by Dr. Alen Blew for dental consultation.  HPI: Lenton Gendreau is a 64 year old male recently diagnosed with prostate cancer with bone metastases.  Patient  with anticipated Xgeva therapy.  Patient is now seen as part of a medically necessary pre-Xgeva therapy dental protocol examination.  The patient currently denies acute toothaches, swellings, or abscesses.  Patient was last seen 4 to 5 years ago to have a tooth #29 pulled.  This was at [facility].  There were no complications.  Patient did not have his lower partial denture repaired at that time to replace tooth #29.  Prior to that, the patient has been [facility] to have multiple teeth extracted and an upper complete and lower acrylic partial denture fabricated.  Patient indicates that the upper complete denture "fits okay" but is "loose".  The lower acrylic partial denture has a broken left distal extension and tooth #29 has not been added to the partial denture after it was extracted.  The patient still uses the upper complete denture and lower acrylic partial denture daily.  The patient denies having dental phobia.  DENTAL EXAMINATION: GENERAL: The patient is a well-developed, well-nourished male in no acute distress.  The patient is wearing an eye patch covering the left eye at this time. HEAD AND NECK: There is no palpable neck lymphadenopathy.  The patient denies acute TMJ symptoms. INTRAORAL EXAM: Patient has normal saliva.  The patient has bilateral mandibular lingual tori.  Patient has a small mid palatal torus. DENTITION: Patient is missing tooth numbers 1 through 16, 17 through 20, 23 through 26, 29, 30, and 32. PERIODONTAL: The patient has chronic periodontitis with plaque and calculus accumulations, gingival recession, and incipient a moderate bone loss.  Tooth mobility is noted as per dental charting form. DENTAL CARIES/SUBOPTIMAL RESTORATIONS: Multiple dental caries are noted as per dental charting form. ENDODONTIC: Patient currently denies acute pulpitis symptoms.  The patient does have periapical pathology and radiolucency associated with apex tooth #31. CROWN AND  BRIDGE: There are no crown or bridge restorations. PROSTHODONTIC: Patient has an upper complete and lower acrylic partial denture that are ill fitting. OCCLUSION: Patient has a poor occlusal scheme and malocclusion.  RADIOGRAPHIC INTERPRETATION: An orthopantogram was taken and supplemented with 6 lower periapical radiographs. There are  multiple missing teeth.  There is moderate bone loss noted.  Periapical pathology and radiolucency associated with apices of tooth #31.  Dental caries are noted.   ASSESSMENTS: 1.  Prostate cancer with bone metastasis 2.  Pre-Xgeva therapy dental protocol 3.  Chronic apical periodontitis 4.  Dental caries 5.  Bilateral mandibular lingual tori 6.  Mid palatal torus. 7.  Chronic periodontitis of bone loss 8.  Gingival recession 9.  Accretions 10.  Tooth mobility 11.  Multiple missing teeth 12.  Ill fitting maxillary complete and mandibular acrylic partial denture 13.  Poor occlusal scheme and malocclusion    PLAN/RECOMMENDATIONS: 1. I discussed the risks, benefits, and complications of various treatment options with the patient in relationship to his medical and dental conditions, anticipated Xgeva therapy, and risks for osteonecrosis of the jaw with future invasive dental procedures secondary to the [facility] therapy.  We discussed various treatment options to include no treatment, total and subtotal extractions with alveoloplasty, bilateral mandibular lingual tori reductions, periodontal therapy, dental restorations, root canal therapy, crown and bridge therapy, implant therapy, and replacement of missing teeth as indicated. The patient currently wishes to think about his options for extraction of tooth #31 only, extraction of tooth #31 with bilateral tori reductions, or extraction of remaining teeth with alveoloplasty and tori reductions.  We also discussed need for dental restorations and periodontal therapy if all remaining teeth are not extracted.  We  discussed treatment with the dentist of his choice, an oral surgeon as indicated, or with Dental Medicine in the operating room with general anesthesia.  Patient will contact Dental Medicine with his decision proceed with in the near future and will contact Dr. Alen Blew to discuss his overall prognosis as indicated.   2. Discussion of findings with medical team and coordination of future medical and dental care as needed.  I spent in excess of  120 minutes during the conduct of this consultation and >50% of this time involved direct face-to-face encounter for counseling and/or coordination of the patient's care.    Lenn Cal, DDS

## 2019-12-15 MED FILL — ABIRATERONE ACETATE 250 MG: 250 | 15 days supply | Qty: 60 | Fill #1

## 2019-12-15 NOTE — Progress Notes (Deleted)
NEUROLOGY CONSULTATION NOTE  Michael Stephenson MRN: [id number] DOB: 01/20/1956  Referring provider: Madison Hickman, MD Primary care provider: Gerlene Fee, DO  Reason for consult:  diplopia  HISTORY OF PRESENT ILLNESS: Michael Stephenson is a 64 year old ***-handed black male with type 2 diabetes mellitus, hypertension, hyperlipidemia and prostate cancer who presents for diplopia.  History supplemented by referring provider note.  In November, ***.  MRI of brain with and without contrast on 11/01/2019 was personally reviewed and was unremarkable.  He has type 2 diabetes mellitus.  Last Hgb A1c from 09/05/2019 was 7.3 He was recently diagnosed with metastatic prostate cancer.  ***  PAST MEDICAL HISTORY: Past Medical History:  Diagnosis Date  . Anxiety   . DM (diabetes mellitus) ([place])   . Heart murmur   . HLD (hyperlipidemia)   . Hypertension     PAST SURGICAL HISTORY: Past Surgical History:  Procedure Laterality Date  . LAPAROSCOPIC APPENDECTOMY  03/30/2012   Procedure: APPENDECTOMY LAPAROSCOPIC;  Surgeon: Gwenyth Ober, MD;  Location: [facility];  Service: General;  Laterality: N/A;  . TONSILLECTOMY      MEDICATIONS: Current Outpatient Medications on File Prior to Visit  Medication Sig Dispense Refill  . abiraterone acetate (ZYTIGA) 250 MG tablet Take 4 tablets (1,000 mg total) by mouth daily. Take on an empty stomach 1 hour before or 2 hours after a meal 120 tablet 0  . amLODipine (NORVASC) 5 MG tablet Take 1 tablet (5 mg total) by mouth at bedtime. 30 tablet 0  . blood glucose meter kit and supplies Dispense based on patient and insurance preference. Use up to four times daily as directed. (FOR ICD-9 250.00, 250.01). 1 each 0  . calcium-vitamin D (OSCAL WITH D) 500-200 MG-UNIT tablet Take 1 tablet by mouth 2 (two) times daily. 120 tablet 3  . gabapentin (NEURONTIN) 100 MG capsule Take 1 capsule (100 mg total) by mouth 3 (three) times daily. 90 capsule 3  . metFORMIN  (GLUCOPHAGE) 1000 MG tablet Take 1 tablet (1,000 mg total) by mouth 2 (two) times daily with a meal. (Patient not taking: Reported on 12/09/2019) 60 tablet 2  . metFORMIN (GLUCOPHAGE-XR) 500 MG 24 hr tablet Take 2 tablets (1,000 mg total) by mouth daily with breakfast. 180 tablet 3  . oxyCODONE (OXY IR/ROXICODONE) 5 MG immediate release tablet Take 1 tablet (5 mg total) by mouth every 4 (four) hours as needed for severe pain. 30 tablet 0  . sildenafil (VIAGRA) 100 MG tablet Take 0.5-1 tablets (50-100 mg total) by mouth daily as needed for erectile dysfunction. (Patient not taking: Reported on 12/09/2019) 10 tablet 0  . tadalafil (ADCIRCA/CIALIS) 20 MG tablet Take 0.5-1 tablets (10-20 mg total) by mouth every other day as needed for erectile dysfunction. (Patient not taking: Reported on 12/09/2019) 5 tablet 11  . [DISCONTINUED] atorvastatin (LIPITOR) 40 MG tablet Take 1 tablet (40 mg total) by mouth daily. 90 tablet 3   No current facility-administered medications on file prior to visit.    ALLERGIES: No Known Allergies  FAMILY HISTORY: Family History  Problem Relation Age of Onset  . Benign prostatic hyperplasia Father   . Heart disease Mother   . Kidney disease Mother   . Diabetes Sister   . Stroke Brother   . Colon cancer Neg Hx   . Esophageal cancer Neg Hx   . Stomach cancer Neg Hx   . Rectal cancer Neg Hx    SOCIAL HISTORY: Social History   Socioeconomic History  .  Marital status: Married    Spouse name: Not on file  . Number of children: 4  . Years of education: Not on file  . Highest education level: Not on file  Occupational History  . Not on file  Tobacco Use  . Smoking status: Former Smoker    Types: Cigarettes    Quit date: 1985    Years since quitting: 36.0  . Smokeless tobacco: Never Used  Substance and Sexual Activity  . Alcohol use: No  . Drug use: Yes    Types: Marijuana  . Sexual activity: Not on file  Other Topics Concern  . Not on file  Social History  Narrative  . Not on file   Social Determinants of Health   Financial Resource Strain:   . Difficulty of Paying Living Expenses: Not on file  Food Insecurity:   . Worried About Charity fundraiser in the Last Year: Not on file  . Ran Out of Food in the Last Year: Not on file  Transportation Needs:   . Lack of Transportation (Medical): Not on file  . Lack of Transportation (Non-Medical): Not on file  Physical Activity:   . Days of Exercise per Week: Not on file  . Minutes of Exercise per Session: Not on file  Stress:   . Feeling of Stress : Not on file  Social Connections:   . Frequency of Communication with Friends and Family: Not on file  . Frequency of Social Gatherings with Friends and Family: Not on file  . Attends Religious Services: Not on file  . Active Member of Clubs or Organizations: Not on file  . Attends Archivist Meetings: Not on file  . Marital Status: Not on file  Intimate Partner Violence:   . Fear of Current or Ex-Partner: Not on file  . Emotionally Abused: Not on file  . Physically Abused: Not on file  . Sexually Abused: Not on file    REVIEW OF SYSTEMS: Constitutional: No fevers, chills, or sweats, no generalized fatigue, change in appetite Eyes: No visual changes, double vision, eye pain Ear, nose and throat: No hearing loss, ear pain, nasal congestion, sore throat Cardiovascular: No chest pain, palpitations Respiratory:  No shortness of breath at rest or with exertion, wheezes GastrointestinaI: No nausea, vomiting, diarrhea, abdominal pain, fecal incontinence Genitourinary:  No dysuria, urinary retention or frequency Musculoskeletal:  No neck pain, back pain Integumentary: No rash, pruritus, skin lesions Neurological: as above Psychiatric: No depression, insomnia, anxiety Endocrine: No palpitations, fatigue, diaphoresis, mood swings, change in appetite, change in weight, increased thirst Hematologic/Lymphatic:  No purpura,  petechiae. Allergic/Immunologic: no itchy/runny eyes, nasal congestion, recent allergic reactions, rashes  PHYSICAL EXAM: *** General: No acute distress.  Patient appears ***-groomed.  *** Head:  Normocephalic/atraumatic Eyes:  fundi examined but not visualized Neck: supple, no paraspinal tenderness, full range of motion Back: No paraspinal tenderness Heart: regular rate and rhythm Lungs: Clear to auscultation bilaterally. Vascular: No carotid bruits. Neurological Exam: Mental status: alert and oriented to person, place, and time, recent and remote memory intact, fund of knowledge intact, attention and concentration intact, speech fluent and not dysarthric, language intact. Cranial nerves: CN I: not tested CN II: pupils equal, round and reactive to light, visual fields intact CN III, IV, VI:  full range of motion, no nystagmus, no ptosis CN V: facial sensation intact CN VII: upper and lower face symmetric CN VIII: hearing intact CN IX, X: gag intact, uvula midline CN XI: sternocleidomastoid and trapezius  muscles intact CN XII: tongue midline Bulk & Tone: normal, no fasciculations. Motor:  5/5 throughout *** Sensation:  Pinprick *** temperature *** and vibration sensation intact.  ***. Deep Tendon Reflexes:  2+ throughout, *** toes downgoing.  *** Finger to nose testing:  Without dysmetria.  *** Heel to shin:  Without dysmetria.  *** Gait:  Normal station and stride.  Able to turn and tandem walk. Romberg ***.  IMPRESSION: ***  PLAN: ***  Thank you for allowing me to take part in the care of this patient.  Metta Clines, DO  CC: ***

## 2019-12-16 ENCOUNTER — Ambulatory Visit: Payer: Self-pay | Admitting: Neurology

## 2019-12-25 ENCOUNTER — Telehealth (HOSPITAL_COMMUNITY): Payer: Self-pay

## 2019-12-25 NOTE — Telephone Encounter (Signed)
I left message for patient to return call to Dental Medicine to discuss his plan of care.

## 2019-12-29 ENCOUNTER — Other Ambulatory Visit: Payer: Self-pay | Admitting: Oncology

## 2019-12-30 ENCOUNTER — Inpatient Hospital Stay (HOSPITAL_BASED_OUTPATIENT_CLINIC_OR_DEPARTMENT_OTHER): Payer: BLUE CROSS/BLUE SHIELD | Admitting: Oncology

## 2019-12-30 ENCOUNTER — Inpatient Hospital Stay: Payer: BLUE CROSS/BLUE SHIELD

## 2019-12-30 ENCOUNTER — Other Ambulatory Visit: Payer: Self-pay

## 2019-12-30 ENCOUNTER — Encounter: Payer: Self-pay | Admitting: Oncology

## 2019-12-30 ENCOUNTER — Inpatient Hospital Stay: Payer: BLUE CROSS/BLUE SHIELD | Attending: Oncology

## 2019-12-30 VITALS — BP 173/83 | HR 79 | Temp 97.8°F | Resp 18 | Ht 68.0 in | Wt 181.5 lb

## 2019-12-30 DIAGNOSIS — C7951 Secondary malignant neoplasm of bone: Secondary | ICD-10-CM | POA: Diagnosis not present

## 2019-12-30 DIAGNOSIS — Z5111 Encounter for antineoplastic chemotherapy: Secondary | ICD-10-CM | POA: Diagnosis not present

## 2019-12-30 DIAGNOSIS — C61 Malignant neoplasm of prostate: Secondary | ICD-10-CM

## 2019-12-30 LAB — CBC WITH DIFFERENTIAL (CANCER CENTER ONLY)
Abs Immature Granulocytes: 0.02 10*3/uL (ref 0.00–0.07)
Basophils Absolute: 0.1 10*3/uL (ref 0.0–0.1)
Basophils Relative: 1 %
Eosinophils Absolute: 0.1 10*3/uL (ref 0.0–0.5)
Eosinophils Relative: 1 %
HCT: 34.4 % — ABNORMAL LOW (ref 39.0–52.0)
Hemoglobin: 10.7 g/dL — ABNORMAL LOW (ref 13.0–17.0)
Immature Granulocytes: 0 %
Lymphocytes Relative: 47 %
Lymphs Abs: 2.3 10*3/uL (ref 0.7–4.0)
MCH: 29 pg (ref 26.0–34.0)
MCHC: 31.1 g/dL (ref 30.0–36.0)
MCV: 93.2 fL (ref 80.0–100.0)
Monocytes Absolute: 0.4 10*3/uL (ref 0.1–1.0)
Monocytes Relative: 8 %
Neutro Abs: 2.2 10*3/uL (ref 1.7–7.7)
Neutrophils Relative %: 43 %
Platelet Count: 209 10*3/uL (ref 150–400)
RBC: 3.69 MIL/uL — ABNORMAL LOW (ref 4.22–5.81)
RDW: 16.7 % — ABNORMAL HIGH (ref 11.5–15.5)
WBC Count: 5 10*3/uL (ref 4.0–10.5)
nRBC: 0.6 % — ABNORMAL HIGH (ref 0.0–0.2)

## 2019-12-30 LAB — CMP (CANCER CENTER ONLY)
ALT: 22 U/L (ref 0–44)
AST: 22 U/L (ref 15–41)
Albumin: 3.8 g/dL (ref 3.5–5.0)
Alkaline Phosphatase: 4446 U/L — ABNORMAL HIGH (ref 38–126)
Anion gap: 8 (ref 5–15)
BUN: 16 mg/dL (ref 8–23)
CO2: 28 mmol/L (ref 22–32)
Calcium: 8.7 mg/dL — ABNORMAL LOW (ref 8.9–10.3)
Chloride: 107 mmol/L (ref 98–111)
Creatinine: 1.02 mg/dL (ref 0.61–1.24)
GFR, Est AFR Am: >60 mL/min (ref >60)
GFR, Estimated: >60 mL/min (ref >60)
Glucose, Bld: 176 mg/dL — ABNORMAL HIGH (ref 70–99)
Potassium: 3.6 mmol/L (ref 3.5–5.1)
Sodium: 143 mmol/L (ref 135–145)
Total Bilirubin: 0.5 mg/dL (ref 0.3–1.2)
Total Protein: 6.9 g/dL (ref 6.5–8.1)

## 2019-12-30 MED ORDER — DEGARELIX ACETATE 80 MG ~~LOC~~ SOLR
80.0000 mg | Freq: Once | SUBCUTANEOUS | Status: AC
  Administered 2019-12-30: 80 mg via SUBCUTANEOUS
  Filled 2019-12-30: qty 4

## 2019-12-30 MED FILL — ABIRATERONE ACETATE 250 MG: 250 | 15 days supply | Qty: 60 | Fill #0

## 2019-12-30 NOTE — Progress Notes (Signed)
Met with patient at registration to advise what is needed to apply for [facility] and Prostate grants. Gave him my card for any additional financial questions or concerns.

## 2019-12-30 NOTE — Progress Notes (Signed)
Hematology and Oncology Follow Up Visit  Michael Stephenson [id number] 06/07/1956 64 y.o. 12/30/2019 9:19 AM Autry-Lott, Breck Coons, Naaman Plummer, DO   Principle Diagnosis: 64 year old man with advanced prostate cancer with disease to the bone diagnosed in November 2020.  He has castration-sensitive after presenting with PSA of 771 and Gleason score 4+5 = 9.   Prior Therapy:  He is status post prostate biopsy completed on October 20, 2019.  Current therapy:  Androgen deprivation therapy started in December 2020.  He received degarelix 240 mg at that time.    Zytiga 1000 mg daily started in January 2021.   Interim History: Michael Stephenson is here for return evaluation.  Since the last visit, he reports no major changes in his health.  He has tolerated Zytiga without any major complaints at this time.  Denies any nausea, vomiting or abdominal pain.  He does report some fatigue tiredness at this time.  He denies any recent hospitalization or illnesses.  His back pain has improved at this time and his use for oxycodone with acetaminophen has decreased.       Medications: Unchanged on review. Current Outpatient Medications  Medication Sig Dispense Refill  . abiraterone acetate (ZYTIGA) 250 MG tablet TAKE 4 TABLETS (1,000 MG TOTAL) BY MOUTH DAILY. TAKE ON AN EMPTY STOMACH 1 HOUR BEFORE OR 2 HOURS AFTER A MEAL 60 tablet 0  . amLODipine (NORVASC) 5 MG tablet Take 1 tablet (5 mg total) by mouth at bedtime. 30 tablet 0  . blood glucose meter kit and supplies Dispense based on patient and insurance preference. Use up to four times daily as directed. (FOR ICD-9 250.00, 250.01). 1 each 0  . calcium-vitamin D (OSCAL WITH D) 500-200 MG-UNIT tablet Take 1 tablet by mouth 2 (two) times daily. 120 tablet 3  . gabapentin (NEURONTIN) 100 MG capsule Take 1 capsule (100 mg total) by mouth 3 (three) times daily. 90 capsule 3  . metFORMIN (GLUCOPHAGE) 1000 MG tablet Take 1 tablet (1,000 mg total) by mouth 2 (two)  times daily with a meal. 60 tablet 2  . metFORMIN (GLUCOPHAGE-XR) 500 MG 24 hr tablet Take 2 tablets (1,000 mg total) by mouth daily with breakfast. 180 tablet 3  . oxyCODONE (OXY IR/ROXICODONE) 5 MG immediate release tablet Take 1 tablet (5 mg total) by mouth every 4 (four) hours as needed for severe pain. 30 tablet 0  . sildenafil (VIAGRA) 100 MG tablet Take 0.5-1 tablets (50-100 mg total) by mouth daily as needed for erectile dysfunction. 10 tablet 0  . tadalafil (ADCIRCA/CIALIS) 20 MG tablet Take 0.5-1 tablets (10-20 mg total) by mouth every other day as needed for erectile dysfunction. 5 tablet 11   No current facility-administered medications for this visit.     Allergies: No Known Allergies      Physical Exam: Blood pressure (!) 173/83, pulse 79, temperature 97.8 F (36.6 C), temperature source Temporal, resp. rate 18, height '5\' 8"'  (1.727 m), weight 181 lb 8 oz (82.3 kg), SpO2 100 %.  ECOG: 1   General appearance: Alert, awake without any distress. Head: Atraumatic without abnormalities Oropharynx: Without any thrush or ulcers. Eyes: No scleral icterus. Lymph nodes: No lymphadenopathy noted in the cervical, supraclavicular, or axillary nodes Heart:regular rate and rhythm, without any murmurs or gallops.   Lung: Clear to auscultation without any rhonchi, wheezes or dullness to percussion. Abdomin: Soft, nontender without any shifting dullness or ascites. Musculoskeletal: No clubbing or cyanosis. Neurological: No motor or sensory deficits. Skin: No rashes or lesions.  Lab Results: Lab Results  Component Value Date   WBC 6.2 11/05/2019   HGB 10.3 (L) 11/05/2019   HCT 33.2 (L) 11/05/2019   MCV 92.7 11/05/2019   PLT 219 11/05/2019     Chemistry      Component Value Date/Time   NA 141 11/05/2019 1139   NA 136 09/16/2019 1112   K 3.6 11/05/2019 1139   CL 104 11/05/2019 1139   CO2 25 11/05/2019 1139   BUN 16 11/05/2019 1139   BUN 18 09/16/2019 1112    CREATININE 1.12 11/05/2019 1139   CREATININE 1.18 04/28/2016 1509      Component Value Date/Time   CALCIUM 8.9 11/05/2019 1139   ALKPHOS 3,542 (H) 11/05/2019 1139   AST 41 11/05/2019 1139   ALT 35 11/05/2019 1139   BILITOT 0.7 11/05/2019 1139        Impression and Plan:   64 year old man with:  1.    Advanced prostate cancer with disease to bone diagnosed in November 2020.  He has castration-sensitive after presenting with Gleason score of 9 and PSA of 771 initially.  He is currently on Zytiga in addition to androgen deprivation without any major complaints.  The natural course of this disease as well as other complications related to long-term Zytiga therapy were reviewed.  These would include hypertension, adrenal insufficiency, edema among others.  Alternative options including systemic chemotherapy and Xtandi were reviewed.  He is agreeable to continue at this time and his PSA is currently pending.  2.  Androgen deprivation: He is a currently on Firmagon which she will receive today and switch to Eligard subsequently.  Complications including weight gain, hot flashes and osteoporosis were reviewed.  He is agreeable to continue.  3.  Bone directed therapy: He is currently on calcium and vitamin D supplements.  [name] has been deferred till he completes dental work.  4.  Bone pain: Related to his prostate cancer.  Improved at this time will rarely use of pain medications.  5.  Prognosis and goals of care: Therapy remains palliative although aggressive measures are warranted given his reasonable performance status.  6.  Hypertension: He has been on Norvasc although has not been taking it.  I urged him to restart and will be refilled for him.  I urged him to get further refills from his primary care physician.  7.  Follow-up: In 4 weeks for a Eligard injection.  30  minutes was spent with on the visit today. The time was dedicated to reviewing his disease status, laboratory  data, disease status update and treatment options.   [name], MD 2/9/20219:19 AM

## 2019-12-31 ENCOUNTER — Telehealth: Payer: Self-pay

## 2019-12-31 ENCOUNTER — Telehealth: Payer: Self-pay | Admitting: Oncology

## 2019-12-31 LAB — PROSTATE-SPECIFIC AG, SERUM (LABCORP): Prostate Specific Ag, Serum: 27.9 ng/mL — ABNORMAL HIGH (ref 0.0–4.0)

## 2019-12-31 NOTE — Telephone Encounter (Signed)
Scheduled appt per 2/9 los.  Was not able to ger in contact with the pt. Sent a message to HIM pool to get a calendar mailed out.

## 2019-12-31 NOTE — Telephone Encounter (Signed)
Called patient and let him know PSA result. Patient verbalized understanding.  

## 2019-12-31 NOTE — Telephone Encounter (Signed)
-----   Message from Wyatt Portela, MD sent at 12/31/2019  8:36 AM EST ----- Please let him know his PSA is down

## 2020-01-01 ENCOUNTER — Other Ambulatory Visit: Payer: Self-pay | Admitting: Oncology

## 2020-01-01 DIAGNOSIS — I10 Essential (primary) hypertension: Secondary | ICD-10-CM

## 2020-01-06 ENCOUNTER — Telehealth: Payer: Self-pay | Admitting: Family Medicine

## 2020-01-06 NOTE — Telephone Encounter (Signed)
Patient came into office stating that he needed a refill on his amLODipine. Please give pt a call.

## 2020-01-11 NOTE — Telephone Encounter (Signed)
It looks as if it was refilled on 01/02/2020 w/ 30 tabs by Dr. Alen Blew. Attempted to call patient and no answer. Generic answering machine so I did not leave a message.   Dr. Janus Molder, DO

## 2020-01-24 ENCOUNTER — Other Ambulatory Visit: Payer: Self-pay | Admitting: Oncology

## 2020-01-24 DIAGNOSIS — I10 Essential (primary) hypertension: Secondary | ICD-10-CM

## 2020-01-27 ENCOUNTER — Encounter: Payer: Self-pay | Admitting: Oncology

## 2020-01-27 ENCOUNTER — Other Ambulatory Visit: Payer: Self-pay

## 2020-01-27 ENCOUNTER — Inpatient Hospital Stay: Payer: BLUE CROSS/BLUE SHIELD | Attending: Oncology

## 2020-01-27 VITALS — BP 138/72 | HR 72 | Temp 98.2°F | Resp 18

## 2020-01-27 DIAGNOSIS — C61 Malignant neoplasm of prostate: Secondary | ICD-10-CM | POA: Diagnosis present

## 2020-01-27 DIAGNOSIS — C7951 Secondary malignant neoplasm of bone: Secondary | ICD-10-CM | POA: Diagnosis present

## 2020-01-27 DIAGNOSIS — Z5111 Encounter for antineoplastic chemotherapy: Secondary | ICD-10-CM | POA: Diagnosis not present

## 2020-01-27 MED ORDER — LEUPROLIDE ACETATE (4 MONTH) 30 MG ~~LOC~~ KIT
30.0000 mg | PACK | Freq: Once | SUBCUTANEOUS | Status: AC
  Administered 2020-01-27: 30 mg via SUBCUTANEOUS
  Filled 2020-01-27: qty 30

## 2020-01-27 NOTE — Progress Notes (Unsigned)
Met with patient at registration whom brought proof of income for one-time $1000 Advertising account executive.  Patient approved for the grant. He has a copy of the approval letter as well as the expense sheet along with the Outpatient pharmacy information. He was given a gift card today from the grant.  He has my card for any additional financial questions or concerns.

## 2020-01-27 NOTE — Patient Instructions (Signed)

## 2020-02-02 ENCOUNTER — Other Ambulatory Visit: Payer: Self-pay | Admitting: Oncology

## 2020-02-05 MED FILL — ABIRATERONE ACETATE 250 MG: 250 | 15 days supply | Qty: 60 | Fill #0

## 2020-02-23 ENCOUNTER — Other Ambulatory Visit: Payer: Self-pay | Admitting: Oncology

## 2020-02-23 DIAGNOSIS — I10 Essential (primary) hypertension: Secondary | ICD-10-CM

## 2020-02-26 ENCOUNTER — Other Ambulatory Visit: Payer: Self-pay | Admitting: Oncology

## 2020-02-26 ENCOUNTER — Telehealth: Payer: Self-pay | Admitting: Oncology

## 2020-02-26 ENCOUNTER — Other Ambulatory Visit: Payer: Self-pay

## 2020-02-26 ENCOUNTER — Inpatient Hospital Stay: Payer: BLUE CROSS/BLUE SHIELD | Attending: Oncology | Admitting: Oncology

## 2020-02-26 ENCOUNTER — Inpatient Hospital Stay: Payer: BLUE CROSS/BLUE SHIELD

## 2020-02-26 VITALS — BP 163/83 | HR 79 | Temp 98.3°F | Resp 18 | Ht 68.0 in | Wt 186.5 lb

## 2020-02-26 DIAGNOSIS — C7951 Secondary malignant neoplasm of bone: Secondary | ICD-10-CM | POA: Insufficient documentation

## 2020-02-26 DIAGNOSIS — Z79899 Other long term (current) drug therapy: Secondary | ICD-10-CM | POA: Diagnosis not present

## 2020-02-26 DIAGNOSIS — I10 Essential (primary) hypertension: Secondary | ICD-10-CM | POA: Insufficient documentation

## 2020-02-26 DIAGNOSIS — C61 Malignant neoplasm of prostate: Secondary | ICD-10-CM

## 2020-02-26 DIAGNOSIS — E291 Testicular hypofunction: Secondary | ICD-10-CM | POA: Diagnosis not present

## 2020-02-26 LAB — CBC WITH DIFFERENTIAL (CANCER CENTER ONLY)
Abs Immature Granulocytes: 0.01 10*3/uL (ref 0.00–0.07)
Basophils Absolute: 0.1 10*3/uL (ref 0.0–0.1)
Basophils Relative: 1 %
Eosinophils Absolute: 0.1 10*3/uL (ref 0.0–0.5)
Eosinophils Relative: 2 %
HCT: 39.5 % (ref 39.0–52.0)
Hemoglobin: 12.2 g/dL — ABNORMAL LOW (ref 13.0–17.0)
Immature Granulocytes: 0 %
Lymphocytes Relative: 47 %
Lymphs Abs: 2.2 10*3/uL (ref 0.7–4.0)
MCH: 28 pg (ref 26.0–34.0)
MCHC: 30.9 g/dL (ref 30.0–36.0)
MCV: 90.6 fL (ref 80.0–100.0)
Monocytes Absolute: 0.3 10*3/uL (ref 0.1–1.0)
Monocytes Relative: 7 %
Neutro Abs: 2.1 10*3/uL (ref 1.7–7.7)
Neutrophils Relative %: 43 %
Platelet Count: 243 10*3/uL (ref 150–400)
RBC: 4.36 MIL/uL (ref 4.22–5.81)
RDW: 15 % (ref 11.5–15.5)
WBC Count: 4.8 10*3/uL (ref 4.0–10.5)
nRBC: 0 % (ref 0.0–0.2)

## 2020-02-26 LAB — CMP (CANCER CENTER ONLY)
ALT: 33 U/L (ref 0–44)
AST: 23 U/L (ref 15–41)
Albumin: 3.7 g/dL (ref 3.5–5.0)
Alkaline Phosphatase: 2479 U/L — ABNORMAL HIGH (ref 38–126)
Anion gap: 7 (ref 5–15)
BUN: 15 mg/dL (ref 8–23)
CO2: 26 mmol/L (ref 22–32)
Calcium: 8.8 mg/dL — ABNORMAL LOW (ref 8.9–10.3)
Chloride: 107 mmol/L (ref 98–111)
Creatinine: 0.99 mg/dL (ref 0.61–1.24)
GFR, Est AFR Am: >60 mL/min (ref >60)
GFR, Estimated: >60 mL/min (ref >60)
Glucose, Bld: 247 mg/dL — ABNORMAL HIGH (ref 70–99)
Potassium: 3.9 mmol/L (ref 3.5–5.1)
Sodium: 140 mmol/L (ref 135–145)
Total Bilirubin: 0.6 mg/dL (ref 0.3–1.2)
Total Protein: 7.2 g/dL (ref 6.5–8.1)

## 2020-02-26 MED FILL — ABIRATERONE ACETATE 250 MG: 250 | 15 days supply | Qty: 60 | Fill #0

## 2020-02-26 NOTE — Progress Notes (Signed)
Hematology and Oncology Follow Up Visit  Michael Stephenson [id number] July 15, 1956 64 y.o. 02/26/2020 9:37 AM Autry-Lott, Breck Coons, Naaman Plummer, DO   Principle Diagnosis: 64 year old man with castration-sensitive prostate cancer diagnosed in November 2020.  He presented with with PSA of 771 and Gleason score 4+5 = 9 and documented bone metastasis.  Prior Therapy:  He is status post prostate biopsy completed on October 20, 2019.  Current therapy:  Androgen deprivation therapy started in December 2020.   He received Eligard 30 mg on January 27, 2020 and will be repeated in 4 months.  Zytiga 1000 mg daily started in January 2021.   Interim History: [name] is here for a follow-up visit.  Since the last visit, he reports no major changes in his health.  He continues to tolerate Zytiga without any major complaints.  He denies any nausea, vomiting or abdominal pain.  He denies any excessive fatigue or tiredness.  He denies any recent hospitalization or illnesses.  His bone pain has resolved at this time although he does report some occasional joint stiffness for which she takes Tylenol.  Denies any lower extremity edema.       Medications: Updated on review. Current Outpatient Medications  Medication Sig Dispense Refill  . abiraterone acetate (ZYTIGA) 250 MG tablet TAKE 4 TABLETS (1,000 MG TOTAL) BY MOUTH DAILY. TAKE ON AN EMPTY STOMACH 1 HOUR BEFORE OR 2 HOURS AFTER A MEAL 60 tablet 0  . amLODipine (NORVASC) 5 MG tablet TAKE 1 TABLET BY MOUTH EVERYDAY AT BEDTIME 30 tablet 0  . blood glucose meter kit and supplies Dispense based on patient and insurance preference. Use up to four times daily as directed. (FOR ICD-9 250.00, 250.01). 1 each 0  . calcium-vitamin D (OSCAL WITH D) 500-200 MG-UNIT tablet Take 1 tablet by mouth 2 (two) times daily. 120 tablet 3  . gabapentin (NEURONTIN) 100 MG capsule Take 1 capsule (100 mg total) by mouth 3 (three) times daily. 90 capsule 3  . metFORMIN  (GLUCOPHAGE) 1000 MG tablet Take 1 tablet (1,000 mg total) by mouth 2 (two) times daily with a meal. 60 tablet 2  . metFORMIN (GLUCOPHAGE-XR) 500 MG 24 hr tablet Take 2 tablets (1,000 mg total) by mouth daily with breakfast. 180 tablet 3  . oxyCODONE (OXY IR/ROXICODONE) 5 MG immediate release tablet Take 1 tablet (5 mg total) by mouth every 4 (four) hours as needed for severe pain. 30 tablet 0  . sildenafil (VIAGRA) 100 MG tablet Take 0.5-1 tablets (50-100 mg total) by mouth daily as needed for erectile dysfunction. 10 tablet 0  . tadalafil (ADCIRCA/CIALIS) 20 MG tablet Take 0.5-1 tablets (10-20 mg total) by mouth every other day as needed for erectile dysfunction. 5 tablet 11   No current facility-administered medications for this visit.     Allergies: No Known Allergies      Physical Exam: Blood pressure (!) 163/83, pulse 79, temperature 98.3 F (36.8 C), temperature source Temporal, resp. rate 18, height '5\' 8"'  (1.727 m), weight 186 lb 8 oz (84.6 kg), SpO2 100 %.   ECOG: 1   General appearance: Comfortable appearing without any discomfort Head: Normocephalic without any trauma Oropharynx: Mucous membranes are moist and pink without any thrush or ulcers. Eyes: Pupils are equal and round reactive to light. Lymph nodes: No cervical, supraclavicular, inguinal or axillary lymphadenopathy.   Heart:regular rate and rhythm.  S1 and S2 without leg edema. Lung: Clear without any rhonchi or wheezes.  No dullness to percussion. Abdomin: Soft, nontender, nondistended  with good bowel sounds.  No hepatosplenomegaly. Musculoskeletal: No joint deformity or effusion.  Full range of motion noted. Neurological: No deficits noted on motor, sensory and deep tendon reflex exam. Skin: No petechial rash or dryness.  Appeared moist.       Lab Results: Lab Results  Component Value Date   WBC 5.0 12/30/2019   HGB 10.7 (L) 12/30/2019   HCT 34.4 (L) 12/30/2019   MCV 93.2 12/30/2019   PLT 209  12/30/2019     Chemistry      Component Value Date/Time   NA 143 12/30/2019 0907   NA 136 09/16/2019 1112   K 3.6 12/30/2019 0907   CL 107 12/30/2019 0907   CO2 28 12/30/2019 0907   BUN 16 12/30/2019 0907   BUN 18 09/16/2019 1112   CREATININE 1.02 12/30/2019 0907   CREATININE 1.18 04/28/2016 1509      Component Value Date/Time   CALCIUM 8.7 (L) 12/30/2019 0907   ALKPHOS 4,446 (H) 12/30/2019 0907   AST 22 12/30/2019 0907   ALT 22 12/30/2019 0907   BILITOT 0.5 12/30/2019 0907     Results for Michael, Stephenson (MRN [id number]) as of 02/26/2020 09:11  Ref. Range 11/05/2019 11:39 12/30/2019 09:07  Prostate Specific Ag, Serum Latest Ref Range: 0.0 - 4.0 ng/mL 1,383.0 (H) 27.9 (H)     Impression and Plan:   64 year old man with:  1.    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  He presented with PSA of 1300 and a Gleason score of 9 in November 2020.   He continues to tolerate Zytiga without any major complications.  His PSA shows excellent predictable response currently at 27.  Risks and benefits of continuing this therapy and alternative options were reviewed at this time including systemic chemotherapy, Xtandi among others.  For the time being I recommended continuing the same dose and schedule.  He is agreeable to continue.  2.  Androgen deprivation: He received Eligard in March 2021 and will be repeated July.  3.  Bone directed therapy: I recommended calcium and vitamin D supplements.  [name] has been deferred till his dental work is complete.  4.  Bone pain: Resolved at this time with improvement in his malignancy.  5.  Prognosis and goals of care: His disease is incurable although aggressive measures are warranted given his excellent performance status.  6.  Hypertension: Blood pressure is mildly elevated today but has been within normal range between visits.  Continue to monitor on Zytiga.  7.  Follow-up: In 3 months for a follow-up.  30  minutes  were dedicated to this encounter.  The time was spent on reviewing his disease status, reviewing laboratory data, treatment options and future plan of care discussion.   [name], MD 4/8/20219:37 AM

## 2020-02-26 NOTE — Telephone Encounter (Signed)
Scheduled appt per 4/8 los.  Spoke with pt and he is aware of his appt date and time.

## 2020-02-27 ENCOUNTER — Telehealth: Payer: Self-pay

## 2020-02-27 LAB — PROSTATE-SPECIFIC AG, SERUM (LABCORP): Prostate Specific Ag, Serum: 3.1 ng/mL (ref 0.0–4.0)

## 2020-02-27 NOTE — Telephone Encounter (Signed)
Called patient and made him aware of his PSA result. Patient verbalized understanding.  

## 2020-02-27 NOTE — Telephone Encounter (Signed)
-----   Message from Wyatt Portela, MD sent at 02/27/2020  8:07 AM EDT ----- Please let him know his PSA is down

## 2020-03-23 ENCOUNTER — Other Ambulatory Visit: Payer: Self-pay | Admitting: Oncology

## 2020-03-25 ENCOUNTER — Other Ambulatory Visit: Payer: Self-pay | Admitting: Oncology

## 2020-03-25 DIAGNOSIS — I10 Essential (primary) hypertension: Secondary | ICD-10-CM

## 2020-03-26 MED FILL — ABIRATERONE ACETATE 250 MG: 250 | 15 days supply | Qty: 60 | Fill #0

## 2020-04-13 ENCOUNTER — Other Ambulatory Visit: Payer: Self-pay | Admitting: Oncology

## 2020-04-14 ENCOUNTER — Other Ambulatory Visit: Payer: Self-pay

## 2020-04-14 MED ORDER — ABIRATERONE ACETATE 250 MG PO TABS: 1000.0000 mg | ORAL_TABLET | Freq: Every day | ORAL | 0 refills | Status: DC

## 2020-04-14 MED FILL — ABIRATERONE ACETATE 250 MG: 250 | 30 days supply | Qty: 120 | Fill #0

## 2020-04-14 NOTE — Telephone Encounter (Signed)
TC from [facility] Pt requesting 120 quantity on Zytiga. OK to change quantity per Dr. Alen Blew

## 2020-04-23 ENCOUNTER — Other Ambulatory Visit: Payer: Self-pay | Admitting: Oncology

## 2020-04-23 DIAGNOSIS — I10 Essential (primary) hypertension: Secondary | ICD-10-CM

## 2020-05-11 ENCOUNTER — Other Ambulatory Visit: Payer: Self-pay | Admitting: Oncology

## 2020-05-12 MED FILL — ABIRATERONE ACETATE 250 MG: 250 | 30 days supply | Qty: 120 | Fill #0

## 2020-05-20 ENCOUNTER — Other Ambulatory Visit: Payer: Self-pay | Admitting: Oncology

## 2020-05-20 DIAGNOSIS — I10 Essential (primary) hypertension: Secondary | ICD-10-CM

## 2020-05-21 ENCOUNTER — Telehealth: Payer: Self-pay | Admitting: Oncology

## 2020-05-21 NOTE — Telephone Encounter (Signed)
Rescheduled 07/08 appointment to 08/11 due to providers pal, patient has been called and voicemail was left.

## 2020-05-27 ENCOUNTER — Ambulatory Visit: Payer: BLUE CROSS/BLUE SHIELD

## 2020-05-27 ENCOUNTER — Other Ambulatory Visit: Payer: BLUE CROSS/BLUE SHIELD

## 2020-05-27 ENCOUNTER — Ambulatory Visit: Payer: BLUE CROSS/BLUE SHIELD | Admitting: Oncology

## 2020-06-01 ENCOUNTER — Other Ambulatory Visit: Payer: Self-pay | Admitting: Oncology

## 2020-06-08 MED FILL — ABIRATERONE ACETATE 250 MG: 250 | 30 days supply | Qty: 120 | Fill #0

## 2020-06-24 ENCOUNTER — Other Ambulatory Visit: Payer: Self-pay | Admitting: Oncology

## 2020-06-24 DIAGNOSIS — I10 Essential (primary) hypertension: Secondary | ICD-10-CM

## 2020-06-30 ENCOUNTER — Other Ambulatory Visit: Payer: Self-pay

## 2020-06-30 ENCOUNTER — Inpatient Hospital Stay: Payer: BLUE CROSS/BLUE SHIELD | Admitting: Oncology

## 2020-06-30 ENCOUNTER — Inpatient Hospital Stay: Payer: BLUE CROSS/BLUE SHIELD

## 2020-06-30 ENCOUNTER — Inpatient Hospital Stay: Payer: BLUE CROSS/BLUE SHIELD | Attending: Oncology

## 2020-06-30 VITALS — BP 188/97 | HR 77 | Temp 96.5°F | Resp 18 | Ht 68.0 in | Wt 189.2 lb

## 2020-06-30 DIAGNOSIS — C7951 Secondary malignant neoplasm of bone: Secondary | ICD-10-CM | POA: Insufficient documentation

## 2020-06-30 DIAGNOSIS — C61 Malignant neoplasm of prostate: Secondary | ICD-10-CM

## 2020-06-30 DIAGNOSIS — Z5111 Encounter for antineoplastic chemotherapy: Secondary | ICD-10-CM | POA: Insufficient documentation

## 2020-06-30 LAB — CBC WITH DIFFERENTIAL (CANCER CENTER ONLY)
Abs Immature Granulocytes: 0.02 10*3/uL (ref 0.00–0.07)
Basophils Absolute: 0.1 10*3/uL (ref 0.0–0.1)
Basophils Relative: 1 %
Eosinophils Absolute: 0.1 10*3/uL (ref 0.0–0.5)
Eosinophils Relative: 1 %
HCT: 42.4 % (ref 39.0–52.0)
Hemoglobin: 13.7 g/dL (ref 13.0–17.0)
Immature Granulocytes: 0 %
Lymphocytes Relative: 50 %
Lymphs Abs: 3.1 10*3/uL (ref 0.7–4.0)
MCH: 28.9 pg (ref 26.0–34.0)
MCHC: 32.3 g/dL (ref 30.0–36.0)
MCV: 89.5 fL (ref 80.0–100.0)
Monocytes Absolute: 0.4 10*3/uL (ref 0.1–1.0)
Monocytes Relative: 6 %
Neutro Abs: 2.6 10*3/uL (ref 1.7–7.7)
Neutrophils Relative %: 42 %
Platelet Count: 216 10*3/uL (ref 150–400)
RBC: 4.74 MIL/uL (ref 4.22–5.81)
RDW: 13.3 % (ref 11.5–15.5)
WBC Count: 6.3 10*3/uL (ref 4.0–10.5)
nRBC: 0 % (ref 0.0–0.2)

## 2020-06-30 LAB — CMP (CANCER CENTER ONLY)
ALT: 14 U/L (ref 0–44)
AST: 15 U/L (ref 15–41)
Albumin: 3.5 g/dL (ref 3.5–5.0)
Alkaline Phosphatase: 459 U/L — ABNORMAL HIGH (ref 38–126)
Anion gap: 11 (ref 5–15)
BUN: 18 mg/dL (ref 8–23)
CO2: 25 mmol/L (ref 22–32)
Calcium: 9.6 mg/dL (ref 8.9–10.3)
Chloride: 103 mmol/L (ref 98–111)
Creatinine: 1.16 mg/dL (ref 0.61–1.24)
GFR, Est AFR Am: >60 mL/min (ref >60)
GFR, Estimated: >60 mL/min (ref >60)
Glucose, Bld: 345 mg/dL — ABNORMAL HIGH (ref 70–99)
Potassium: 3.9 mmol/L (ref 3.5–5.1)
Sodium: 139 mmol/L (ref 135–145)
Total Bilirubin: 0.8 mg/dL (ref 0.3–1.2)
Total Protein: 7.1 g/dL (ref 6.5–8.1)

## 2020-06-30 MED ORDER — LEUPROLIDE ACETATE (4 MONTH) 30 MG ~~LOC~~ KIT
30.0000 mg | PACK | Freq: Once | SUBCUTANEOUS | Status: AC
  Administered 2020-06-30: 30 mg via SUBCUTANEOUS

## 2020-06-30 MED ORDER — LEUPROLIDE ACETATE (4 MONTH) 30 MG ~~LOC~~ KIT
PACK | SUBCUTANEOUS | Status: AC
  Filled 2020-06-30: qty 30

## 2020-06-30 NOTE — Progress Notes (Signed)
Hematology and Oncology Follow Up Visit  Michael Stephenson [id number] May 29, 1956 64 y.o. 06/30/2020 8:09 AM Michael Stephenson, Michael Plummer, DO   Principle Diagnosis: 64 year old man with advanced prostate cancer with disease to the bone presented with with PSA of 771 and Gleason score 4+5 = 9 diagnosed in November 2020.  Prior Therapy:  He is status post prostate biopsy completed on October 20, 2019.  Current therapy:   Eligard 30 mg repeated in 4 months.   Zytiga 1000 mg daily started in January 2021.  Next injection scheduled for today.  Interim History: Michael Stephenson returns today for a repeat evaluation.  Since the last visit, he reports no major changes in his health.  He reports no nausea, vomiting or abdominal pain on Zytiga.  He denies any excessive fatigue tiredness or changes in his bowel habits.  He denies any bone pain or pathological fractures.  He does report some occasional arthralgias but does not interfere with his daily function.       Medications: Unchanged after review. Current Outpatient Medications  Medication Sig Dispense Refill  . abiraterone acetate (ZYTIGA) 250 MG tablet TAKE 4 TABLETS (1,000 MG TOTAL) BY MOUTH DAILY. TAKE ON AN EMPTY STOMACH 1 HOUR BEFORE OR 2 HOURS AFTER A MEAL 120 tablet 0  . amLODipine (NORVASC) 5 MG tablet TAKE 1 TABLET BY MOUTH EVERYDAY AT BEDTIME 30 tablet 0  . blood glucose meter kit and supplies Dispense based on patient and insurance preference. Use up to four times daily as directed. (FOR ICD-9 250.00, 250.01). 1 each 0  . calcium-vitamin D (OSCAL WITH D) 500-200 MG-UNIT tablet Take 1 tablet by mouth 2 (two) times daily. 120 tablet 3  . gabapentin (NEURONTIN) 100 MG capsule Take 1 capsule (100 mg total) by mouth 3 (three) times daily. 90 capsule 3  . metFORMIN (GLUCOPHAGE) 1000 MG tablet Take 1 tablet (1,000 mg total) by mouth 2 (two) times daily with a meal. 60 tablet 2  . metFORMIN (GLUCOPHAGE-XR) 500 MG 24 hr tablet Take 2  tablets (1,000 mg total) by mouth daily with breakfast. 180 tablet 3  . oxyCODONE (OXY IR/ROXICODONE) 5 MG immediate release tablet Take 1 tablet (5 mg total) by mouth every 4 (four) hours as needed for severe pain. 30 tablet 0  . sildenafil (VIAGRA) 100 MG tablet Take 0.5-1 tablets (50-100 mg total) by mouth daily as needed for erectile dysfunction. 10 tablet 0  . tadalafil (ADCIRCA/CIALIS) 20 MG tablet Take 0.5-1 tablets (10-20 mg total) by mouth every other day as needed for erectile dysfunction. 5 tablet 11   No current facility-administered medications for this visit.     Allergies: No Known Allergies      Physical Exam: Blood pressure (!) 188/97, pulse 77, temperature (!) 96.5 F (35.8 C), temperature source Axillary, resp. rate 18, height _0  (1.727 m), weight 189 lb 3.2 oz (85.8 kg), SpO2 100 %.    ECOG: 1    General appearance: Alert, awake without any distress. Head: Atraumatic without abnormalities Oropharynx: Without any thrush or ulcers. Eyes: No scleral icterus. Lymph nodes: No lymphadenopathy noted in the cervical, supraclavicular, or axillary nodes Heart:regular rate and rhythm, without any murmurs or gallops.   Lung: Clear to auscultation without any rhonchi, wheezes or dullness to percussion. Abdomin: Soft, nontender without any shifting dullness or ascites. Musculoskeletal: No clubbing or cyanosis. Neurological: No motor or sensory deficits. Skin: No rashes or lesions.     Lab Results: Lab Results  Component Value Date  WBC 6.3 06/30/2020   HGB 13.7 06/30/2020   HCT 42.4 06/30/2020   MCV 89.5 06/30/2020   PLT 216 06/30/2020     Chemistry      Component Value Date/Time   NA 140 02/26/2020 0931   NA 136 09/16/2019 1112   K 3.9 02/26/2020 0931   CL 107 02/26/2020 0931   CO2 26 02/26/2020 0931   BUN 15 02/26/2020 0931   BUN 18 09/16/2019 1112   CREATININE 0.99 02/26/2020 0931   CREATININE 1.18 04/28/2016 1509      Component Value  Date/Time   CALCIUM 8.8 (L) 02/26/2020 0931   ALKPHOS 2,479 (H) 02/26/2020 0931   AST 23 02/26/2020 0931   ALT 33 02/26/2020 0931   BILITOT 0.6 02/26/2020 0931       Results for Michael Stephenson (MRN [id number]) as of 06/30/2020 08:11  Ref. Range 12/30/2019 09:07 02/26/2020 09:31  Prostate Specific Ag, Serum Latest Ref Range: 0.0 - 4.0 ng/mL 27.9 (H) 3.1    Impression and Plan:   64 year old man with:  1.  Advanced prostate cancer with disease to the bone diagnosed in November 2020.  He was found to have  Castration-sensitive disease.   He continues to have excellent clinical response and a PSA decline is currently at 3.1 after presenting with a PSA of over 700.  Risks and benefits of continuing Zytiga at this time were reviewed.  Potential complications that include nausea, fatigue, edema and hypertension were reiterated.  Alternative treatment options such as chemotherapy among other agents are deferred till he developed castration-resistant disease.  He is agreeable to continue with this plan.  2.  Androgen deprivation: He will receive Eligard today and repeated in 4 months.  Long-term complications including weight gain, hot flashes among others were reviewed.  3.  Bone directed therapy: [name] has been deferred due to dental issues and no dental clearance.  I recommended calcium and vitamin D supplements.  4.  Bone pain: Resolved at this time although he does have occasional arthralgias.  He does not require any pain medication.  5.  Prognosis and goals of care: Therapy remains palliative although aggressive measures are warranted given his excellent performance status.  6.  Hypertension: His blood pressure remains elevated between visits.  I urged him to follow-up with his primary care physician and potentially increase his blood pressure medication.  7.  Follow-up: In 4 months for repeat evaluation.  30  minutes were spent on this visit.  The time was dedicated to  reviewing laboratory data, discussing disease status, addressing treatment complications and future plan of care review.   [name], MD 8/11/20218:09 AM

## 2020-06-30 NOTE — Patient Instructions (Signed)

## 2020-07-01 ENCOUNTER — Telehealth: Payer: Self-pay

## 2020-07-01 ENCOUNTER — Telehealth: Payer: Self-pay | Admitting: Oncology

## 2020-07-01 LAB — PROSTATE-SPECIFIC AG, SERUM (LABCORP): Prostate Specific Ag, Serum: 0.5 ng/mL (ref 0.0–4.0)

## 2020-07-01 NOTE — Telephone Encounter (Signed)
Scheduled per 08/11 los, patient has been called and voicemail was left.

## 2020-07-01 NOTE — Telephone Encounter (Signed)
TC to pt per Dr Alen Blew to let him know that his PSA is down at 0.5 patient verbalized understanding and appreciates call.

## 2020-07-01 NOTE — Telephone Encounter (Signed)
Left voicemail for pt to call back [facility] 

## 2020-07-01 NOTE — Telephone Encounter (Signed)
-----   Message from Michael Portela, MD sent at 07/01/2020  8:15 AM EDT ----- Please let him know his PSA is down

## 2020-07-16 ENCOUNTER — Other Ambulatory Visit: Payer: Self-pay | Admitting: Oncology

## 2020-07-16 MED FILL — ABIRATERONE ACETATE 250 MG: 250 | 30 days supply | Qty: 120 | Fill #0

## 2020-07-22 ENCOUNTER — Other Ambulatory Visit: Payer: Self-pay | Admitting: Oncology

## 2020-07-22 DIAGNOSIS — I10 Essential (primary) hypertension: Secondary | ICD-10-CM

## 2020-08-22 ENCOUNTER — Other Ambulatory Visit: Payer: Self-pay | Admitting: Oncology

## 2020-08-22 DIAGNOSIS — I10 Essential (primary) hypertension: Secondary | ICD-10-CM

## 2020-08-27 ENCOUNTER — Telehealth: Payer: Self-pay | Admitting: Pharmacist

## 2020-08-27 NOTE — Telephone Encounter (Signed)
Oral Chemotherapy Pharmacist Encounter   Multiple attempts made to reach patient for follow up and refill on oral medication: Zytiga (abiraterone).   Unsuccessful in attempts to reach patient. Voicemail left with Oral Chemotherapy Clinic number for patient to call back.   Leron Croak, PharmD, BCPS Hematology/Oncology Clinical Pharmacist [facility] [phone] 08/27/2020 11:11 AM

## 2020-09-10 ENCOUNTER — Other Ambulatory Visit: Payer: Self-pay | Admitting: Oncology

## 2020-09-15 ENCOUNTER — Other Ambulatory Visit: Payer: Self-pay | Admitting: Oncology

## 2020-09-15 DIAGNOSIS — I10 Essential (primary) hypertension: Secondary | ICD-10-CM

## 2020-09-17 ENCOUNTER — Other Ambulatory Visit: Payer: Self-pay | Admitting: Pharmacist

## 2020-09-17 ENCOUNTER — Telehealth: Payer: Self-pay

## 2020-09-17 DIAGNOSIS — C61 Malignant neoplasm of prostate: Secondary | ICD-10-CM

## 2020-09-17 MED ORDER — ABIRATERONE ACETATE 250 MG PO TABS: 1000.0000 mg | ORAL_TABLET | Freq: Every day | ORAL | 0 refills | Status: AC

## 2020-09-17 NOTE — Telephone Encounter (Signed)
Oral Oncology Patient Advocate Encounter  Received notification from [employer] that prior authorization for Michael Stephenson is required.  PA submitted on CoverMyMeds Key [id number] Status is pending  Oral Oncology Clinic will continue to follow.   [place] Patient [place] Phone [phone] Fax [phone] 09/17/2020 1:44 PM

## 2020-09-17 NOTE — Telephone Encounter (Signed)
Oral Oncology Patient Advocate Encounter  Prior Authorization for Linnell Fulling has been approved.    PA# [id number] Effective dates: 09/17/20 through 09/17/21  Patient must use [facility] will continue to follow.   [place] Patient [place] Phone [phone] Fax [phone] 09/17/2020 2:10 PM

## 2020-09-17 NOTE — Progress Notes (Signed)
Oral Oncology Pharmacist Encounter  Prescription refill for Michael Stephenson is required to now be filled through [facility]. Patient has had a change in insurance, and new insurance does not allow Zytiga to be filled at [facility]. Prescription redirected to [facility].  Called and spoke with Mr. Portman and he is aware that Michael Stephenson will be coming from [employer].   Leron Croak, PharmD, BCPS Hematology/Oncology Clinical Pharmacist [facility] [phone] 09/17/2020 1:50 PM

## 2020-10-09 ENCOUNTER — Other Ambulatory Visit: Payer: Self-pay | Admitting: Oncology

## 2020-10-09 DIAGNOSIS — I10 Essential (primary) hypertension: Secondary | ICD-10-CM

## 2020-10-26 ENCOUNTER — Other Ambulatory Visit: Payer: Self-pay

## 2020-10-27 ENCOUNTER — Inpatient Hospital Stay: Payer: 59 | Attending: Family Medicine

## 2020-10-27 ENCOUNTER — Inpatient Hospital Stay: Payer: 59

## 2020-10-27 ENCOUNTER — Inpatient Hospital Stay: Payer: 59 | Admitting: Oncology

## 2020-10-27 ENCOUNTER — Telehealth: Payer: Self-pay | Admitting: Oncology

## 2020-10-27 NOTE — Telephone Encounter (Signed)
Called pt per 12/8 sch msg - no answer. Left message for pt to call back to reschedule.

## 2020-11-16 ENCOUNTER — Other Ambulatory Visit: Payer: Self-pay | Admitting: Oncology

## 2020-11-16 DIAGNOSIS — I10 Essential (primary) hypertension: Secondary | ICD-10-CM

## 2020-11-22 ENCOUNTER — Telehealth: Payer: Self-pay | Admitting: Oncology

## 2020-11-22 NOTE — Telephone Encounter (Signed)
Rescheduled appointment per 1/3 schedule message. Patient is aware of changes. 

## 2020-11-23 ENCOUNTER — Inpatient Hospital Stay: Payer: Self-pay

## 2020-11-23 ENCOUNTER — Other Ambulatory Visit: Payer: Self-pay

## 2020-11-23 ENCOUNTER — Encounter: Payer: Self-pay | Admitting: Oncology

## 2020-11-23 ENCOUNTER — Inpatient Hospital Stay: Payer: Self-pay | Attending: Oncology

## 2020-11-23 ENCOUNTER — Inpatient Hospital Stay (HOSPITAL_BASED_OUTPATIENT_CLINIC_OR_DEPARTMENT_OTHER): Payer: Self-pay | Admitting: Oncology

## 2020-11-23 VITALS — BP 157/91 | HR 84 | Temp 98.0°F | Resp 17 | Ht 68.0 in | Wt 196.6 lb

## 2020-11-23 DIAGNOSIS — C61 Malignant neoplasm of prostate: Secondary | ICD-10-CM

## 2020-11-23 DIAGNOSIS — Z5111 Encounter for antineoplastic chemotherapy: Secondary | ICD-10-CM | POA: Insufficient documentation

## 2020-11-23 DIAGNOSIS — C7951 Secondary malignant neoplasm of bone: Secondary | ICD-10-CM | POA: Insufficient documentation

## 2020-11-23 DIAGNOSIS — Z79899 Other long term (current) drug therapy: Secondary | ICD-10-CM | POA: Insufficient documentation

## 2020-11-23 LAB — CBC WITH DIFFERENTIAL (CANCER CENTER ONLY)
Abs Immature Granulocytes: 0.04 10*3/uL (ref 0.00–0.07)
Basophils Absolute: 0.1 10*3/uL (ref 0.0–0.1)
Basophils Relative: 1 %
Eosinophils Absolute: 0.1 10*3/uL (ref 0.0–0.5)
Eosinophils Relative: 1 %
HCT: 46.2 % (ref 39.0–52.0)
Hemoglobin: 15.2 g/dL (ref 13.0–17.0)
Immature Granulocytes: 1 %
Lymphocytes Relative: 34 %
Lymphs Abs: 2.1 10*3/uL (ref 0.7–4.0)
MCH: 31.1 pg (ref 26.0–34.0)
MCHC: 32.9 g/dL (ref 30.0–36.0)
MCV: 94.7 fL (ref 80.0–100.0)
Monocytes Absolute: 0.5 10*3/uL (ref 0.1–1.0)
Monocytes Relative: 7 %
Neutro Abs: 3.4 10*3/uL (ref 1.7–7.7)
Neutrophils Relative %: 56 %
Platelet Count: 200 10*3/uL (ref 150–400)
RBC: 4.88 MIL/uL (ref 4.22–5.81)
RDW: 13 % (ref 11.5–15.5)
WBC Count: 6.1 10*3/uL (ref 4.0–10.5)
nRBC: 0 % (ref 0.0–0.2)

## 2020-11-23 LAB — CMP (CANCER CENTER ONLY)
ALT: 25 U/L (ref 0–44)
AST: 24 U/L (ref 15–41)
Albumin: 3.7 g/dL (ref 3.5–5.0)
Alkaline Phosphatase: 161 U/L — ABNORMAL HIGH (ref 38–126)
Anion gap: 10 (ref 5–15)
BUN: 14 mg/dL (ref 8–23)
CO2: 26 mmol/L (ref 22–32)
Calcium: 9.4 mg/dL (ref 8.9–10.3)
Chloride: 101 mmol/L (ref 98–111)
Creatinine: 1.24 mg/dL (ref 0.61–1.24)
GFR, Estimated: >60 mL/min (ref >60)
Glucose, Bld: 357 mg/dL — ABNORMAL HIGH (ref 70–99)
Potassium: 4 mmol/L (ref 3.5–5.1)
Sodium: 137 mmol/L (ref 135–145)
Total Bilirubin: 0.8 mg/dL (ref 0.3–1.2)
Total Protein: 7.4 g/dL (ref 6.5–8.1)

## 2020-11-23 MED ORDER — LEUPROLIDE ACETATE (4 MONTH) 30 MG ~~LOC~~ KIT
PACK | SUBCUTANEOUS | Status: AC
  Filled 2020-11-23: qty 30

## 2020-11-23 MED ORDER — LEUPROLIDE ACETATE (4 MONTH) 30 MG ~~LOC~~ KIT
30.0000 mg | PACK | Freq: Once | SUBCUTANEOUS | Status: AC
  Administered 2020-11-23: 30 mg via SUBCUTANEOUS

## 2020-11-23 NOTE — Progress Notes (Signed)
Hematology and Oncology Follow Up Visit  Michael Stephenson [id number] 08/16/1956 65 y.o. 11/23/2020 9:46 AM [place], Breck Coons, Naaman Plummer, DO   Principle Diagnosis: 65 year old man with castration-sensitive advanced prostate cancer with disease to the bone diagnosed in November 2020.  He was found to have PSA of 771 and Gleason score 4+5 = 9.   Prior Therapy:  He is status post prostate biopsy completed on October 20, 2019.  Current therapy:   Eligard 30 mg repeated in 4 months.  He will receive Eligard today and repeated in 4 months.  Zytiga 1000 mg daily started in January 2021.    Interim History: Michael Stephenson is here for return follow-up.  Since the last visit, he reports no major changes in his health.  He has tolerated Zytiga reasonably well although he has not been taking it for the last 2 months after changing his insurance carrier.  He had denied any recent hospitalization or illnesses.  He denies any bone pain or pathological fractures.  His performance status and quality of life remains unchanged.  He denies any bone pain or pathological fractures.       Medications: Updated on review. Current Outpatient Medications  Medication Sig Dispense Refill  . abiraterone acetate (ZYTIGA) 250 MG tablet Take 4 tablets (1,000 mg total) by mouth daily. Take on an empty stomach 1 hour before or 2 hours after a meal 120 tablet 0  . amLODipine (NORVASC) 5 MG tablet TAKE 1 TABLET BY MOUTH EVERYDAY AT BEDTIME 30 tablet 0  . blood glucose meter kit and supplies Dispense based on patient and insurance preference. Use up to four times daily as directed. (FOR ICD-9 250.00, 250.01). 1 each 0  . calcium-vitamin D (OSCAL WITH D) 500-200 MG-UNIT tablet Take 1 tablet by mouth 2 (two) times daily. 120 tablet 3  . gabapentin (NEURONTIN) 100 MG capsule Take 1 capsule (100 mg total) by mouth 3 (three) times daily. 90 capsule 3  . metFORMIN (GLUCOPHAGE) 1000 MG tablet Take 1 tablet (1,000 mg total)  by mouth 2 (two) times daily with a meal. 60 tablet 2  . metFORMIN (GLUCOPHAGE-XR) 500 MG 24 hr tablet Take 2 tablets (1,000 mg total) by mouth daily with breakfast. 180 tablet 3  . oxyCODONE (OXY IR/ROXICODONE) 5 MG immediate release tablet Take 1 tablet (5 mg total) by mouth every 4 (four) hours as needed for severe pain. 30 tablet 0  . sildenafil (VIAGRA) 100 MG tablet Take 0.5-1 tablets (50-100 mg total) by mouth daily as needed for erectile dysfunction. 10 tablet 0  . tadalafil (ADCIRCA/CIALIS) 20 MG tablet Take 0.5-1 tablets (10-20 mg total) by mouth every other day as needed for erectile dysfunction. 5 tablet 11   No current facility-administered medications for this visit.     Allergies: No Known Allergies      Physical Exam:  Blood pressure (!) 157/91, pulse 84, temperature 98 F (36.7 C), temperature source Tympanic, resp. rate 17, height '5\' 8"'  (1.727 m), weight 196 lb 9.6 oz (89.2 kg), SpO2 98 %.    ECOG: 1   General appearance: Comfortable appearing without any discomfort Head: Normocephalic without any trauma Oropharynx: Mucous membranes are moist and pink without any thrush or ulcers. Eyes: Pupils are equal and round reactive to light. Lymph nodes: No cervical, supraclavicular, inguinal or axillary lymphadenopathy.   Heart:regular rate and rhythm.  S1 and S2 without leg edema. Lung: Clear without any rhonchi or wheezes.  No dullness to percussion. Abdomin: Soft, nontender, nondistended with good  bowel sounds.  No hepatosplenomegaly. Musculoskeletal: No joint deformity or effusion.  Full range of motion noted. Neurological: No deficits noted on motor, sensory and deep tendon reflex exam. Skin: No petechial rash or dryness.  Appeared moist.       Lab Results: Lab Results  Component Value Date   WBC 6.3 06/30/2020   HGB 13.7 06/30/2020   HCT 42.4 06/30/2020   MCV 89.5 06/30/2020   PLT 216 06/30/2020     Chemistry      Component Value Date/Time   NA 139  06/30/2020 0757   NA 136 09/16/2019 1112   K 3.9 06/30/2020 0757   CL 103 06/30/2020 0757   CO2 25 06/30/2020 0757   BUN 18 06/30/2020 0757   BUN 18 09/16/2019 1112   CREATININE 1.16 06/30/2020 0757   CREATININE 1.18 04/28/2016 1509      Component Value Date/Time   CALCIUM 9.6 06/30/2020 0757   ALKPHOS 459 (H) 06/30/2020 0757   AST 15 06/30/2020 0757   ALT 14 06/30/2020 0757   BILITOT 0.8 06/30/2020 0757       Results for Michael Stephenson, Michael Stephenson (MRN [id number]) as of 11/23/2020 09:48  Ref. Range 02/26/2020 09:31 06/30/2020 07:57  Prostate Specific Ag, Serum Latest Ref Range: 0.0 - 4.0 ng/mL 3.1 0.5     Impression and Plan:   65 year old man with:  1.  Castration-sensitive advanced prostate cancer with disease to the bone diagnosed in November 2020.   The natural course of his disease was reviewed at this time.  And treatment options were reiterated.  He continues to tolerate Zytiga without any major complaints.  His PSA is experiencing excellent response currently at 0.5 and pending from today.  Risks and benefits of continuing this medication were reviewed.  Complication clinic hypertension, renal insufficiency among others were reiterated.  Alternative treatment options such as systemic chemotherapy will be deferred unless he develops progression of disease.  I recommended continuing Zytiga and we will assist him in obtaining coverage for the time being.   2.  Androgen deprivation: He will continue to receive Eligard every 4 months.  Complications including weight gain, hot flashes among others were reviewed.  He is due for injection today.  3.  Bone directed therapy: He will continue to be on calcium and vitamin D supplements.  [name] will be utilized upon obtaining dental clearance.  4.  Bone pain: Resolved at this time.  5.  Prognosis and goals of care: His disease is incurable although aggressive measures are warranted given his excellent performance status.  6.   Hypertension: Blood pressure remains elevated although his Norvasc has been increased by his primary care physician.  7.  Follow-up: He will return in 4 months for a follow-up evaluation.  30  minutes were dedicated to this visit.  Time was spent on reviewing his disease status, reviewing laboratory data and treatment options for the future.   [name], MD 1/4/20229:46 AM

## 2020-11-23 NOTE — Progress Notes (Signed)
Met with patient in lobby and provided [employer] application for [place] settlement. Advised supportive documentation needed along with application to be submitted to Customer Service. He verbalized understanding.  Advised message sent to Mickel Baas w/ drug replacement inquiring about possible assistance with Lupron due to no copay assistance available for drug or diagnosis. Advised if there is any assistance, she will reach out to him directly.  He has my card for any additional financial questions or concerns.

## 2020-11-23 NOTE — Patient Instructions (Signed)

## 2020-11-24 ENCOUNTER — Telehealth: Payer: Self-pay | Admitting: *Deleted

## 2020-11-24 LAB — PROSTATE-SPECIFIC AG, SERUM (LABCORP): Prostate Specific Ag, Serum: 1.1 ng/mL (ref 0.0–4.0)

## 2020-11-24 NOTE — Telephone Encounter (Signed)
-----   Message from Benjiman Core, MD sent at 11/24/2020  8:38 AM EST ----- Please let him know his PSA is slightly up. He needs to be back on Zytiga like we discussed yesterday.

## 2020-11-24 NOTE — Telephone Encounter (Signed)
Notified of message below

## 2021-02-15 ENCOUNTER — Other Ambulatory Visit (HOSPITAL_COMMUNITY): Payer: Self-pay

## 2021-02-24 DIAGNOSIS — Z0271 Encounter for disability determination: Secondary | ICD-10-CM

## 2021-03-15 ENCOUNTER — Encounter: Payer: Self-pay | Admitting: Oncology

## 2021-03-15 NOTE — Progress Notes (Signed)
GFE(Good Faith Estimate) created and printed to give to patient.

## 2021-03-23 ENCOUNTER — Inpatient Hospital Stay: Payer: Self-pay | Admitting: Oncology

## 2021-03-23 ENCOUNTER — Telehealth: Payer: Self-pay | Admitting: *Deleted

## 2021-03-23 ENCOUNTER — Encounter: Payer: Self-pay | Admitting: Oncology

## 2021-03-23 ENCOUNTER — Inpatient Hospital Stay: Payer: Self-pay | Attending: Family Medicine

## 2021-03-23 ENCOUNTER — Telehealth: Payer: Self-pay | Admitting: Oncology

## 2021-03-23 ENCOUNTER — Inpatient Hospital Stay: Payer: Self-pay

## 2021-03-23 NOTE — Progress Notes (Signed)
Placed GFE(Good Faith Estimate) in outgoing mail to patient.  He has my card for any additional financial questions or concerns.

## 2021-03-23 NOTE — Telephone Encounter (Signed)
Contacted patient at mobile/preferred number regarding missed appts today.  Left message for patient to call office and also to contact scheduling to r/s appts.

## 2021-03-23 NOTE — Telephone Encounter (Signed)
R/s appts per 5/4 sch msg. Called pt, no answer. Left msg with appt date and time.

## 2021-04-19 ENCOUNTER — Inpatient Hospital Stay: Payer: Self-pay | Admitting: Oncology

## 2021-04-19 ENCOUNTER — Inpatient Hospital Stay: Payer: Self-pay

## 2021-04-19 ENCOUNTER — Telehealth: Payer: Self-pay | Admitting: *Deleted

## 2021-04-19 NOTE — Telephone Encounter (Signed)
PC to patient regarding missed appointments today, no answer left VM to call office [phone].  Scheduling message sent to reschedule appointments.

## 2021-04-20 ENCOUNTER — Telehealth: Payer: Self-pay | Admitting: Oncology

## 2021-04-20 NOTE — Telephone Encounter (Signed)
R/s appts per 5/31 sch msg. Called pt, no answer. Left msg with appts date and time.

## 2021-06-02 ENCOUNTER — Telehealth: Payer: Self-pay | Admitting: *Deleted

## 2021-06-02 ENCOUNTER — Inpatient Hospital Stay: Payer: Self-pay | Attending: Family Medicine | Admitting: Oncology

## 2021-06-02 ENCOUNTER — Inpatient Hospital Stay: Payer: Self-pay

## 2021-06-02 NOTE — Telephone Encounter (Signed)
PC to patient regarding missed appointments today.  Patient states he is being seen by an oncologist at the [facility] and will not be returning to this office.  MD informed.

## 2021-12-29 ENCOUNTER — Other Ambulatory Visit (HOSPITAL_COMMUNITY): Payer: Self-pay | Admitting: Student

## 2021-12-29 ENCOUNTER — Other Ambulatory Visit (HOSPITAL_COMMUNITY): Payer: No Typology Code available for payment source

## 2021-12-29 DIAGNOSIS — C61 Malignant neoplasm of prostate: Secondary | ICD-10-CM

## 2022-01-04 ENCOUNTER — Other Ambulatory Visit (HOSPITAL_COMMUNITY): Payer: Self-pay

## 2022-01-09 ENCOUNTER — Encounter: Payer: Self-pay | Admitting: Oncology

## 2022-01-11 ENCOUNTER — Encounter (HOSPITAL_COMMUNITY): Admission: RE | Admit: 2022-01-11 | Payer: No Typology Code available for payment source | Source: Ambulatory Visit

## 2022-01-11 ENCOUNTER — Encounter (HOSPITAL_COMMUNITY): Payer: Self-pay

## 2022-01-12 ENCOUNTER — Other Ambulatory Visit (HOSPITAL_COMMUNITY): Payer: Self-pay | Admitting: Student

## 2022-01-12 DIAGNOSIS — C61 Malignant neoplasm of prostate: Secondary | ICD-10-CM

## 2022-01-26 ENCOUNTER — Other Ambulatory Visit (HOSPITAL_COMMUNITY): Payer: No Typology Code available for payment source

## 2022-02-22 ENCOUNTER — Encounter (HOSPITAL_COMMUNITY)
Admission: RE | Admit: 2022-02-22 | Discharge: 2022-02-22 | Disposition: A | Discharge status: Home / Self Care | Payer: No Typology Code available for payment source | Source: Ambulatory Visit | Attending: Diagnostic Radiology | Admitting: Diagnostic Radiology

## 2022-02-22 DIAGNOSIS — C61 Malignant neoplasm of prostate: Secondary | ICD-10-CM | POA: Insufficient documentation

## 2022-02-22 MED ORDER — PIFLIFOLASTAT F 18 (PYLARIFY) INJECTION
9.0000 | Freq: Once | INTRAVENOUS | Status: AC
  Administered 2022-02-22: 9.5 via INTRAVENOUS

## 2022-04-25 ENCOUNTER — Emergency Department (HOSPITAL_COMMUNITY)
Admission: EM | Admit: 2022-04-25 | Discharge: 2022-04-25 | Disposition: A | Discharge status: Home / Self Care | Payer: No Typology Code available for payment source | Attending: Emergency Medicine | Admitting: Emergency Medicine

## 2022-04-25 ENCOUNTER — Encounter (HOSPITAL_COMMUNITY): Payer: Self-pay

## 2022-04-25 ENCOUNTER — Other Ambulatory Visit: Payer: Self-pay

## 2022-04-25 ENCOUNTER — Emergency Department (HOSPITAL_COMMUNITY): Payer: No Typology Code available for payment source

## 2022-04-25 DIAGNOSIS — M79604 Pain in right leg: Secondary | ICD-10-CM | POA: Diagnosis present

## 2022-04-25 DIAGNOSIS — Z8546 Personal history of malignant neoplasm of prostate: Secondary | ICD-10-CM | POA: Diagnosis not present

## 2022-04-25 MED ORDER — OXYCODONE HCL 5 MG PO TABS
5.0000 mg | ORAL_TABLET | Freq: Once | ORAL | Status: AC
  Administered 2022-04-25: 5 mg via ORAL
  Filled 2022-04-25: qty 1

## 2022-04-25 NOTE — ED Triage Notes (Addendum)
Pt arrived POV from home c/o right leg pain x2 weeks that has gradually gotten worse. Pt states he has not noticed any swelling and the pain is in the back of his thigh. Pt denies any falls or injuries.

## 2022-04-25 NOTE — ED Provider Notes (Signed)
[facility] EMERGENCY DEPARTMENT Provider Note   CSN: [id number] Arrival date & time: 04/25/22  1155     History  Chief Complaint  Patient presents with   Leg Pain    Michael Stephenson is a 66 y.o. male with a PMHx of prostate cancer who presents to the ED complaining of right leg pain onset 2 weeks. He didn't call his oncologist today prior to arrival to the ED. he was evaluated by his oncologist on 04/21/2022 and sent a prescription for Norco.  Denies recent fall, injury, trauma, heavy lifting.  Recently completed his chemotherapy for prostate cancer.  Also has been taking tramadol without relief in his symptoms.  Denies fever, chills, gait problem, joint swelling, color change, wound.  The history is provided by the patient. No language interpreter was used.      Home Medications Prior to Admission medications   Medication Sig Start Date End Date Taking? Authorizing Provider  abiraterone acetate (ZYTIGA) 250 MG tablet Take 4 tablets (1,000 mg total) by mouth daily. Take on an empty stomach 1 hour before or 2 hours after a meal 09/17/20   Wyatt Portela, MD  amLODipine (NORVASC) 5 MG tablet TAKE 1 TABLET BY MOUTH EVERYDAY AT BEDTIME 11/16/20   Wyatt Portela, MD  blood glucose meter kit and supplies Dispense based on patient and insurance preference. Use up to four times daily as directed. (FOR ICD-9 250.00, 250.01). 04/05/15   Regalado, Belkys A, MD  calcium-vitamin D (OSCAL WITH D) 500-200 MG-UNIT tablet Take 1 tablet by mouth 2 (two) times daily. 11/11/19   Wyatt Portela, MD  gabapentin (NEURONTIN) 100 MG capsule Take 1 capsule (100 mg total) by mouth 3 (three) times daily. 09/05/19   Autry-Lott, Naaman Plummer, DO  metFORMIN (GLUCOPHAGE) 1000 MG tablet Take 1 tablet (1,000 mg total) by mouth 2 (two) times daily with a meal. 03/21/19   Nuala Alpha, MD  metFORMIN (GLUCOPHAGE-XR) 500 MG 24 hr tablet Take 2 tablets (1,000 mg total) by mouth daily with breakfast. 10/09/19    Winfrey, Alcario Drought, MD  oxyCODONE (OXY IR/ROXICODONE) 5 MG immediate release tablet Take 1 tablet (5 mg total) by mouth every 4 (four) hours as needed for severe pain. 11/11/19   Wyatt Portela, MD  sildenafil (VIAGRA) 100 MG tablet Take 0.5-1 tablets (50-100 mg total) by mouth daily as needed for erectile dysfunction. 04/28/16   McKeag, Marylynn Pearson, MD  tadalafil (ADCIRCA/CIALIS) 20 MG tablet Take 0.5-1 tablets (10-20 mg total) by mouth every other day as needed for erectile dysfunction. 03/21/19   Nuala Alpha, MD  atorvastatin (LIPITOR) 40 MG tablet Take 1 tablet (40 mg total) by mouth daily. 01/02/19 07/09/19  Harriet Butte, DO      Allergies    Patient has no known allergies.    Review of Systems   Review of Systems  Constitutional:  Negative for chills and fever.  Musculoskeletal:  Positive for arthralgias. Negative for gait problem and joint swelling.  Skin:  Negative for color change and wound.  All other systems reviewed and are negative.  Physical Exam Updated Vital Signs BP (!) 134/94 (BP Location: Right Arm)   Pulse 62   Temp 98.1 F (36.7 C)   Resp 16   Ht $R'5\' 9"'yu$  (1.753 m)   Wt 90.7 kg   SpO2 99%   BMI 29.53 kg/m  Physical Exam Vitals and nursing note reviewed.  Constitutional:      General: He is not in acute distress.  Appearance: Normal appearance.  Eyes:     General: No scleral icterus.    Extraocular Movements: Extraocular movements intact.  Cardiovascular:     Rate and Rhythm: Normal rate.  Pulmonary:     Effort: Pulmonary effort is normal. No respiratory distress.  Musculoskeletal:     Cervical back: Neck supple.     Comments: Able to ambulate without assistance or difficulty. No TTP to right hip. Able to flex and extend without difficulty.  Skin:    General: Skin is warm and dry.     Findings: No bruising, erythema or rash.  Neurological:     Mental Status: He is alert.  Psychiatric:        Behavior: Behavior normal.    ED Results / Procedures /  Treatments   Labs (all labs ordered are listed, but only abnormal results are displayed) Labs Reviewed - No data to display  EKG None  Radiology DG Hip Unilat W or Wo Pelvis 2-3 Views Right  Result Date: 04/25/2022 CLINICAL DATA:  Right posterior hip pain for 2 weeks. History of metastatic to bone prostate cancer. EXAM: DG HIP (WITH OR WITHOUT PELVIS) 2-3V RIGHT COMPARISON:  September 17, 2019 FINDINGS: There is no evidence of hip fracture or dislocation. Heterogeneous appearance of the bilateral iliac bones, right sacrum, right inferior pubic ramus, right proximal femur. Sclerotic appearance of segments of L4 and L5 vertebral bodies. IMPRESSION: 1. No acute fracture or dislocation identified about the right hip. 2. Heterogeneous appearance of the bilateral iliac bones, right sacrum, right inferior pubic ramus, right proximal femur, which in correlation with patient's prior studies represent foci of metastatic disease. Electronically Signed   By: Fidela Salisbury M.D.   On: 04/25/2022 13:04    Procedures Procedures    Medications Ordered in ED Medications  oxyCODONE (Oxy IR/ROXICODONE) immediate release tablet 5 mg (5 mg Oral Given 04/25/22 1605)    ED Course/ Medical Decision Making/ A&P                           Medical Decision Making  Pt presents with right hip/upper leg pain onset 2 weeks.  Denies recent injury, trauma, fall, heavy lifting.  Has a history of prostate cancer and recently finished chemotherapy within the past 2 weeks.  He is followed at the [place].  Given a prescription for Norco on 04/21/2022.  Has been treated with tramadol since 04/13/2022.  Patient notes he is still able to ambulate without difficulty.  Vital signs, stable, patient afebrile. On exam, pt with Able to ambulate without assistance or difficulty. No TTP to right hip. Able to flex and extend without difficulty. No acute cardiovascular, respiratory exam findings. Differential diagnosis includes fracture,  dislocation, contusion, .    Co morbidities that complicate the patient evaluation: Prostate cancer (recently finished chemo).  Additional history obtained:  External records from outside source obtained and reviewed including: Patient was evaluated at the [place] on 04/21/2022 for similar symptoms.  At that time had negative femur x-ray.  An x-ray of the right hip that showed similar concerns today.  He was sent a prescription for Norco.  Also previously given a prescription for tramadol.   Imaging: I ordered imaging studies including right hip xray I independently visualized and interpreted imaging which showed:  1. No acute fracture or dislocation identified about the right hip.  2. Heterogeneous appearance of the bilateral iliac bones, right  sacrum, right inferior pubic ramus, right proximal femur,  which in  correlation with patient's prior studies represent foci of  metastatic disease.   I agree with the radiologist interpretation  Medications:  I ordered medication including oxycodone for symptom management I have reviewed the patients home medicines and have made adjustments as needed   Disposition: Presentation suspicious for right hip/femur pain in the setting of known possible metastatic lesions.  Doubt fracture or dislocation at this time.  After consideration of the diagnostic results and the patients response to treatment, I feel that the patient would benefit from Discharge home.  PDMP reviewed, patiently recently sent a prescription for Norco on 04/21/2022.  Discussed with patient importance of maintaining follow-up with his oncologist at the [place].  Supportive care measures and strict return precautions discussed with patient at bedside. Pt acknowledges and verbalizes understanding. Pt appears safe for discharge. Follow up as indicated in discharge paperwork.    This chart was dictated using voice recognition software, Dragon. Despite the best efforts of this provider to proofread  and correct errors, errors may still occur which can change documentation meaning.  Final Clinical Impression(s) / ED Diagnoses Final diagnoses:  Right leg pain    Rx / DC Orders ED Discharge Orders     None         Blain Hunsucker A, PA-C 04/25/22 1635    Horton, Alvin Critchley, DO 04/26/22 1058

## 2022-04-25 NOTE — ED Provider Triage Note (Signed)
Emergency Medicine Provider Triage Evaluation Note  Michael Stephenson , a 66 y.o. male  was evaluated in triage.  Pt complains of right hip pain.  Patient has a history of prostate cancer which he is receiving treatment.  He is noted 2-week history of right hip pain.  Had x-ray done at the [place] on this past Friday which showed potential cancerous lesions.  He was given tramadol as well as a couple other medicines with minimal relief of symptoms.  He presents for persistent pain.  Denies fever, known trauma to said area.  Review of Systems  Positive:  Negative: See above  Physical Exam  BP (!) 157/105 (BP Location: Right Arm)   Pulse (!) 111   Temp 98 F (36.7 C) (Oral)   Resp 14   Ht '5\' 9"'$  (1.753 m)   Wt 90.7 kg   SpO2 97%   BMI 29.53 kg/m  Gen:   Awake, no distress   Resp:  Normal effort  MSK:   Moves extremities without difficulty  Other:    Medical Decision Making  Medically screening exam initiated at 12:35 PM.  Appropriate orders placed.  Michael Stephenson was informed that the remainder of the evaluation will be completed by another provider, this initial triage assessment does not replace that evaluation, and the importance of remaining in the ED until their evaluation is complete.     Wilnette Kales, [place] 04/25/22 [phone]

## 2022-04-25 NOTE — Discharge Instructions (Addendum)
It was a pleasure taking care of you today!  Your x-ray today in the emergency department was comparable to the x-ray that was obtained while at the [place] on 04/21/2022 showing concerns for metastatic lesions to the hip.  There is no acute fracture or dislocation noted on the x-rays today.  It is important that you call your oncologist today to set up a follow-up appointment regarding today's ED visit.  You may continue taking your prescription medications as prescribed to you.  Return to the emergency department if you are experiencing increasing/worsening pain, inability to walk, fever, worsening symptoms.

## 2022-07-03 ENCOUNTER — Other Ambulatory Visit: Payer: Self-pay

## 2022-07-03 ENCOUNTER — Emergency Department (HOSPITAL_COMMUNITY): Payer: No Typology Code available for payment source

## 2022-07-03 ENCOUNTER — Encounter (HOSPITAL_COMMUNITY): Payer: Self-pay | Admitting: Emergency Medicine

## 2022-07-03 ENCOUNTER — Emergency Department (HOSPITAL_COMMUNITY)
Admission: EM | Admit: 2022-07-03 | Discharge: 2022-07-04 | Disposition: A | Discharge status: Home / Self Care | Payer: No Typology Code available for payment source | Attending: Emergency Medicine | Admitting: Emergency Medicine

## 2022-07-03 DIAGNOSIS — Z7984 Long term (current) use of oral hypoglycemic drugs: Secondary | ICD-10-CM | POA: Insufficient documentation

## 2022-07-03 DIAGNOSIS — D649 Anemia, unspecified: Secondary | ICD-10-CM | POA: Insufficient documentation

## 2022-07-03 DIAGNOSIS — E119 Type 2 diabetes mellitus without complications: Secondary | ICD-10-CM | POA: Insufficient documentation

## 2022-07-03 DIAGNOSIS — C61 Malignant neoplasm of prostate: Secondary | ICD-10-CM | POA: Diagnosis not present

## 2022-07-03 DIAGNOSIS — R0602 Shortness of breath: Secondary | ICD-10-CM | POA: Insufficient documentation

## 2022-07-03 DIAGNOSIS — I10 Essential (primary) hypertension: Secondary | ICD-10-CM | POA: Insufficient documentation

## 2022-07-03 DIAGNOSIS — Z79899 Other long term (current) drug therapy: Secondary | ICD-10-CM | POA: Insufficient documentation

## 2022-07-03 DIAGNOSIS — Z8673 Personal history of transient ischemic attack (TIA), and cerebral infarction without residual deficits: Secondary | ICD-10-CM | POA: Diagnosis not present

## 2022-07-03 LAB — BASIC METABOLIC PANEL
Anion gap: 13 (ref 5–15)
BUN: 17 mg/dL (ref 8–23)
CO2: 21 mmol/L — ABNORMAL LOW (ref 22–32)
Calcium: 8.7 mg/dL — ABNORMAL LOW (ref 8.9–10.3)
Chloride: 101 mmol/L (ref 98–111)
Creatinine, Ser: 1.12 mg/dL (ref 0.61–1.24)
GFR, Estimated: >60 mL/min (ref >60)
Glucose, Bld: 184 mg/dL — ABNORMAL HIGH (ref 70–99)
Potassium: 3.7 mmol/L (ref 3.5–5.1)
Sodium: 135 mmol/L (ref 135–145)

## 2022-07-03 LAB — CBC WITH DIFFERENTIAL/PLATELET
Abs Immature Granulocytes: 0.08 10*3/uL — ABNORMAL HIGH (ref 0.00–0.07)
Basophils Absolute: 0 10*3/uL (ref 0.0–0.1)
Basophils Relative: 1 %
Eosinophils Absolute: 0 10*3/uL (ref 0.0–0.5)
Eosinophils Relative: 0 %
HCT: 34.1 % — ABNORMAL LOW (ref 39.0–52.0)
Hemoglobin: 11 g/dL — ABNORMAL LOW (ref 13.0–17.0)
Immature Granulocytes: 1 %
Lymphocytes Relative: 21 %
Lymphs Abs: 1.7 10*3/uL (ref 0.7–4.0)
MCH: 31.1 pg (ref 26.0–34.0)
MCHC: 32.3 g/dL (ref 30.0–36.0)
MCV: 96.3 fL (ref 80.0–100.0)
Monocytes Absolute: 0.6 10*3/uL (ref 0.1–1.0)
Monocytes Relative: 7 %
Neutro Abs: 5.9 10*3/uL (ref 1.7–7.7)
Neutrophils Relative %: 70 %
Platelets: 484 10*3/uL — ABNORMAL HIGH (ref 150–400)
RBC: 3.54 MIL/uL — ABNORMAL LOW (ref 4.22–5.81)
RDW: 14.6 % (ref 11.5–15.5)
WBC: 8.3 10*3/uL (ref 4.0–10.5)
nRBC: 0 % (ref 0.0–0.2)

## 2022-07-03 LAB — TROPONIN I (HIGH SENSITIVITY): Troponin I (High Sensitivity): 16 ng/L (ref <18)

## 2022-07-03 LAB — D-DIMER, QUANTITATIVE: D-Dimer, Quant: 4.87 ug/mL-FEU — ABNORMAL HIGH (ref 0.00–0.50)

## 2022-07-03 NOTE — ED Provider Triage Note (Signed)
Emergency Medicine Provider Triage Evaluation Note  Michael Stephenson , a 66 y.o. male  was evaluated in triage.  Pt complains of attention.  Reports that he was at the [place] earlier today and was found to be hypotensive with systolic pressure in the 85U.  Patient came to the emergency department for further evaluation.  Has not taken anything his blood pressure medications today.  Patient also endorses shortness of breath.  Review of Systems  Positive: Hypotension, shortness of breath Negative: Chest pain, lightheadedness, syncope, hemoptysis, leg swelling or tenderness  Physical Exam  BP (!) 146/93   Pulse (!) 114   Temp 98.5 F (36.9 C) (Oral)   Resp 18   SpO2 97%  Gen:   Awake, no distress   Resp:  Normal effort, auscultation bilaterally MSK:   Moves extremities without difficulty; swelling or tenderness full lower legs bilaterally Other:  +2 radial pulse bilaterally   Medical Decision Making  Medically screening exam initiated at 7:09 PM.  Appropriate orders placed.  Michael Stephenson was informed that the remainder of the evaluation will be completed by another provider, this initial triage assessment does not replace that evaluation, and the importance of remaining in the ED until their evaluation is complete.     Loni Beckwith, [place] 07/03/22 1909

## 2022-07-04 ENCOUNTER — Emergency Department (HOSPITAL_COMMUNITY): Payer: No Typology Code available for payment source

## 2022-07-04 LAB — PHOSPHORUS: Phosphorus: 2.3 mg/dL — ABNORMAL LOW (ref 2.5–4.6)

## 2022-07-04 LAB — TROPONIN I (HIGH SENSITIVITY): Troponin I (High Sensitivity): 13 ng/L (ref <18)

## 2022-07-04 MED ORDER — K PHOS MONO-SOD PHOS DI & MONO 155-852-130 MG PO TABS
250.0000 mg | ORAL_TABLET | Freq: Once | ORAL | Status: DC
  Filled 2022-07-04: qty 1

## 2022-07-04 MED ORDER — IOHEXOL 350 MG/ML SOLN
58.0000 mL | Freq: Once | INTRAVENOUS | Status: AC | PRN
  Administered 2022-07-04: 58 mL via INTRAVENOUS

## 2022-07-04 MED ORDER — MORPHINE SULFATE (PF) 4 MG/ML IV SOLN
4.0000 mg | Freq: Once | INTRAVENOUS | Status: AC
  Administered 2022-07-04: 4 mg via INTRAVENOUS
  Filled 2022-07-04: qty 1

## 2022-07-04 NOTE — ED Provider Notes (Signed)
[facility] EMERGENCY DEPARTMENT Provider Note   CSN: [id number] Arrival date & time: 07/03/22  1830     History  Chief Complaint  Patient presents with   Abnormal Labs    Michael Stephenson is a 66 y.o. male status postchemotherapy currently undergoing radiation therapy for prostate cancer who presents on referral from PCP at the [place] with concern for low phosphorus.  Patient states he was evaluated today in the outpatient setting for a few days of shortness of breath primarily with exertion without any chest pain.  Some sensation that his heart is racing.  States that he was directed to the ED given concern for critically low phosphorus in the Outpatient setting; unfortunately unable to see these lab results on chart review at this time.  In addition to the above listed history he has history of type 2 diabetes, TIA, and hypertension.  He is not anticoagulated.  HPI     Home Medications Prior to Admission medications   Medication Sig Start Date End Date Taking? Authorizing Provider  abiraterone acetate (ZYTIGA) 250 MG tablet Take 4 tablets (1,000 mg total) by mouth daily. Take on an empty stomach 1 hour before or 2 hours after a meal 09/17/20   Wyatt Portela, MD  amLODipine (NORVASC) 5 MG tablet TAKE 1 TABLET BY MOUTH EVERYDAY AT BEDTIME 11/16/20   Wyatt Portela, MD  blood glucose meter kit and supplies Dispense based on patient and insurance preference. Use up to four times daily as directed. (FOR ICD-9 250.00, 250.01). 04/05/15   Regalado, Belkys A, MD  calcium-vitamin D (OSCAL WITH D) 500-200 MG-UNIT tablet Take 1 tablet by mouth 2 (two) times daily. 11/11/19   Wyatt Portela, MD  gabapentin (NEURONTIN) 100 MG capsule Take 1 capsule (100 mg total) by mouth 3 (three) times daily. 09/05/19   Autry-Lott, Naaman Plummer, DO  metFORMIN (GLUCOPHAGE) 1000 MG tablet Take 1 tablet (1,000 mg total) by mouth 2 (two) times daily with a meal. 03/21/19   Nuala Alpha, MD  metFORMIN  (GLUCOPHAGE-XR) 500 MG 24 hr tablet Take 2 tablets (1,000 mg total) by mouth daily with breakfast. 10/09/19   Winfrey, Alcario Drought, MD  oxyCODONE (OXY IR/ROXICODONE) 5 MG immediate release tablet Take 1 tablet (5 mg total) by mouth every 4 (four) hours as needed for severe pain. 11/11/19   Wyatt Portela, MD  sildenafil (VIAGRA) 100 MG tablet Take 0.5-1 tablets (50-100 mg total) by mouth daily as needed for erectile dysfunction. 04/28/16   McKeag, Marylynn Pearson, MD  tadalafil (ADCIRCA/CIALIS) 20 MG tablet Take 0.5-1 tablets (10-20 mg total) by mouth every other day as needed for erectile dysfunction. 03/21/19   Nuala Alpha, MD  atorvastatin (LIPITOR) 40 MG tablet Take 1 tablet (40 mg total) by mouth daily. 01/02/19 07/09/19  Harriet Butte, DO      Allergies    Patient has no known allergies.    Review of Systems   Review of Systems  Constitutional: Negative.   HENT: Negative.    Respiratory:  Positive for shortness of breath. Negative for cough, chest tightness and wheezing.   Cardiovascular:  Positive for palpitations. Negative for chest pain and leg swelling.  Gastrointestinal: Negative.   Genitourinary: Negative.   Musculoskeletal: Negative.   Skin: Negative.   Neurological: Negative.     Physical Exam Updated Vital Signs BP (!) 188/81 (BP Location: Right Arm)   Pulse (!) 111   Temp 97.9 F (36.6 C) (Oral)   Resp 17  SpO2 95%  Physical Exam Vitals and nursing note reviewed.  Constitutional:      Appearance: He is not ill-appearing or toxic-appearing.  HENT:     Head: Normocephalic and atraumatic.     Mouth/Throat:     Mouth: Mucous membranes are moist.     Pharynx: No oropharyngeal exudate or posterior oropharyngeal erythema.  Eyes:     General:        Right eye: No discharge.        Left eye: No discharge.     Extraocular Movements: Extraocular movements intact.     Conjunctiva/sclera: Conjunctivae normal.     Pupils: Pupils are equal, round, and reactive to light.   Cardiovascular:     Rate and Rhythm: Normal rate and regular rhythm.     Pulses: Normal pulses.     Heart sounds: Normal heart sounds. No murmur heard. Pulmonary:     Effort: Pulmonary effort is normal. No respiratory distress.     Breath sounds: Normal breath sounds. No wheezing or rales.  Abdominal:     General: Bowel sounds are normal. There is no distension.     Palpations: Abdomen is soft.     Tenderness: There is no abdominal tenderness. There is no right CVA tenderness, left CVA tenderness, guarding or rebound.  Musculoskeletal:        General: No deformity.     Cervical back: Neck supple.     Right lower leg: No edema.     Left lower leg: No edema.  Skin:    General: Skin is warm and dry.     Capillary Refill: Capillary refill takes less than 2 seconds.  Neurological:     General: No focal deficit present.     Mental Status: He is alert and oriented to person, place, and time. Mental status is at baseline.  Psychiatric:        Mood and Affect: Mood normal.     ED Results / Procedures / Treatments   Labs (all labs ordered are listed, but only abnormal results are displayed) Labs Reviewed  D-DIMER, QUANTITATIVE - Abnormal; Notable for the following components:      Result Value   D-Dimer, Quant 4.87 (*)    All other components within normal limits  BASIC METABOLIC PANEL - Abnormal; Notable for the following components:   CO2 21 (*)    Glucose, Bld 184 (*)    Calcium 8.7 (*)    All other components within normal limits  CBC WITH DIFFERENTIAL/PLATELET - Abnormal; Notable for the following components:   RBC 3.54 (*)    Hemoglobin 11.0 (*)    HCT 34.1 (*)    Platelets 484 (*)    Abs Immature Granulocytes 0.08 (*)    All other components within normal limits  PHOSPHORUS - Abnormal; Notable for the following components:   Phosphorus 2.3 (*)    All other components within normal limits  TROPONIN I (HIGH SENSITIVITY)  TROPONIN I (HIGH SENSITIVITY)    EKG EKG  Interpretation  Date/Time:  Monday July 03 2022 19:48:12 EDT Ventricular Rate:  109 PR Interval:  138 QRS Duration: 82 QT Interval:  330 QTC Calculation: 444 R Axis:   -54 Text Interpretation: Sinus tachycardia with frequent Premature ventricular complexes Left axis deviation Possible Anterior infarct , age undetermined Abnormal ECG When compared with ECG of 02-Jan-2019 14:18, No significant change was found Confirmed by Orpah Greek [phone]) on 07/03/2022 11:36:28 PM  Radiology CT Angio Chest PE W/Cm &/Or  Wo Cm  Result Date: 07/04/2022 CLINICAL DATA:  Shortness of breath. EXAM: CT ANGIOGRAPHY CHEST WITH CONTRAST TECHNIQUE: Multidetector CT imaging of the chest was performed using the standard protocol during bolus administration of intravenous contrast. Multiplanar CT image reconstructions and MIPs were obtained to evaluate the vascular anatomy. RADIATION DOSE REDUCTION: This exam was performed according to the departmental dose-optimization program which includes automated exposure control, adjustment of the mA and/or kV according to patient size and/or use of iterative reconstruction technique. CONTRAST:  92m OMNIPAQUE IOHEXOL 350 MG/ML SOLN COMPARISON:  None Available. FINDINGS: Cardiovascular: There is mild calcification of the aortic arch, without evidence of aortic aneurysm. Satisfactory opacification of the pulmonary arteries to the segmental level. No evidence of pulmonary embolism. Normal heart size. No pericardial effusion. Mediastinum/Nodes: No enlarged mediastinal, hilar, or axillary lymph nodes. Thyroid gland, trachea, and esophagus demonstrate no significant findings. Lungs/Pleura: Very mild atelectatic changes are seen along the posterior aspect of the bilateral lower lobes. There is no evidence of an acute infiltrate, pleural effusion or pneumothorax. Upper Abdomen: There is a small hiatal hernia. Musculoskeletal: Innumerable sclerotic foci are seen scattered throughout the  osseous structures. Review of the MIP images confirms the above findings. IMPRESSION: 1. No evidence of pulmonary embolism or other acute infiltrate. 2. Findings consistent with diffuse osseous metastasis. 3. Small hiatal hernia. 4. Aortic atherosclerosis. Aortic Atherosclerosis (ICD10-I70.0). Electronically Signed   By: TVirgina NorfolkM.D.   On: 07/04/2022 04:06   DG Chest 2 View  Result Date: 07/03/2022 CLINICAL DATA:  Shortness of breath. EXAM: CHEST - 2 VIEW COMPARISON:  03/30/2012. FINDINGS: The heart size and mediastinal contours are within normal limits. There is atherosclerotic calcification of the aorta. No consolidation, effusion, or pneumothorax. No acute osseous abnormality. IMPRESSION: No active cardiopulmonary disease. Electronically Signed   By: LBrett FairyM.D.   On: 07/03/2022 20:10    Procedures Procedures   Medications Ordered in ED Medications  phosphorus (K PHOS NEUTRAL) tablet 250 mg (has no administration in time range)  morphine (PF) 4 MG/ML injection 4 mg (4 mg Intravenous Given 07/04/22 0135)  iohexol (OMNIPAQUE) 350 MG/ML injection 58 mL (58 mLs Intravenous Contrast Given 07/04/22 0342)    ED Course/ Medical Decision Making/ A&P                           Medical Decision Making 66year old male with known prostate cancer who presents with concern for shortness of breath on exertion for the last 2 days as well as critically low phosphorus reported in the outpatient setting.  Hypertensive and tachycardic on intake, vital signs otherwise normal.  Cardiopulmonary exam at time of my evaluation is normal, abdominal exam is benign.  Neurovascular intact in all extremities without lower extremity edema.  Amount and/or Complexity of Data Reviewed Labs: ordered.    Details: CBC with anemia with hemoglobin 11, decreased from patient's baseline near 13, however currently undergoing treatment for prostate cancer.  BMP with low bicarb of 21, otherwise unremarkable.  Troponin  is negative x2, D-dimer elevated to 4.87 as ordered by triage provider. Phosphorus is mildly low, 2.3. Radiology: ordered.    Details: Chest x-ray negative for acute cardiopulmonary disease, CAT negative for PE at this time, visualized by this provider. ECG/medicine tests:     Details: EKG without STEMI or dangerous dysrhythmia.  Risk Prescription drug management.   Given concern for exertional shortness of breath in context of malignancy we will proceed with CT PE  study at this time.  Patient is asymptomatic at time of my evaluation resting comfortably in his bed.  PE study negative, patient no longer tachycardic, feeling well and ready to be discharged home. AT this time given normal troponins, negative PE study, and reassuring hemodynamics, do feel he is safe to be discharged home. Admission considered, however do not feel admission would offer patient any medical advantage over close outpatient followup.   Kennie  voiced understanding of his medical evaluation and treatment plan. Each of their questions answered to their expressed satisfaction.  Return precautions were given.  Patient is well-appearing, stable, and was discharged in good condition.  This chart was dictated using voice recognition software, Dragon. Despite the best efforts of this provider to proofread and correct errors, errors may still occur which can change documentation meaning. Final Clinical Impression(s) / ED Diagnoses Final diagnoses:  SOB (shortness of breath) on exertion  Hypophosphatemia    Rx / DC Orders ED Discharge Orders     None         Emeline Darling, PA-C 07/04/22 0527    Orpah Greek, MD 07/04/22 [phone]

## 2022-07-04 NOTE — Discharge Instructions (Addendum)
You were seen in the ER tonight for your shortness of breath.You also were evaluated for your low phosphorus. You were not found to have any blood clots in your lungs, nor does there appear to be any emergent problem with your heart or your lungs today. Your phosphate was replaced with a single dose in the ER tonight. You may follow up with your PCP for reevaluation of this level. Please return to the ER with any new severe symptoms.

## 2022-07-04 NOTE — ED Notes (Signed)
Patient transported to CT 

## 2022-09-06 ENCOUNTER — Encounter (HOSPITAL_COMMUNITY): Payer: Self-pay

## 2022-09-06 ENCOUNTER — Emergency Department (HOSPITAL_COMMUNITY): Payer: No Typology Code available for payment source

## 2022-09-06 ENCOUNTER — Inpatient Hospital Stay (HOSPITAL_COMMUNITY)
Admission: EM | Admit: 2022-09-06 | Discharge: 2022-09-20 | DRG: 542 | Disposition: E | Payer: No Typology Code available for payment source | Attending: Internal Medicine | Admitting: Internal Medicine

## 2022-09-06 DIAGNOSIS — Z923 Personal history of irradiation: Secondary | ICD-10-CM

## 2022-09-06 DIAGNOSIS — Z87891 Personal history of nicotine dependence: Secondary | ICD-10-CM

## 2022-09-06 DIAGNOSIS — Z515 Encounter for palliative care: Secondary | ICD-10-CM

## 2022-09-06 DIAGNOSIS — K852 Alcohol induced acute pancreatitis without necrosis or infection: Secondary | ICD-10-CM | POA: Diagnosis present

## 2022-09-06 DIAGNOSIS — I10 Essential (primary) hypertension: Secondary | ICD-10-CM | POA: Diagnosis present

## 2022-09-06 DIAGNOSIS — Z7969 Long term (current) use of other immunomodulators and immunosuppressants: Secondary | ICD-10-CM

## 2022-09-06 DIAGNOSIS — C7951 Secondary malignant neoplasm of bone: Secondary | ICD-10-CM | POA: Diagnosis not present

## 2022-09-06 DIAGNOSIS — Z23 Encounter for immunization: Secondary | ICD-10-CM

## 2022-09-06 DIAGNOSIS — Z7984 Long term (current) use of oral hypoglycemic drugs: Secondary | ICD-10-CM

## 2022-09-06 DIAGNOSIS — E1165 Type 2 diabetes mellitus with hyperglycemia: Secondary | ICD-10-CM | POA: Diagnosis present

## 2022-09-06 DIAGNOSIS — E876 Hypokalemia: Secondary | ICD-10-CM | POA: Diagnosis present

## 2022-09-06 DIAGNOSIS — Z841 Family history of disorders of kidney and ureter: Secondary | ICD-10-CM

## 2022-09-06 DIAGNOSIS — Z79899 Other long term (current) drug therapy: Secondary | ICD-10-CM

## 2022-09-06 DIAGNOSIS — R739 Hyperglycemia, unspecified: Secondary | ICD-10-CM

## 2022-09-06 DIAGNOSIS — E860 Dehydration: Secondary | ICD-10-CM | POA: Diagnosis present

## 2022-09-06 DIAGNOSIS — D649 Anemia, unspecified: Principal | ICD-10-CM | POA: Diagnosis present

## 2022-09-06 DIAGNOSIS — R748 Abnormal levels of other serum enzymes: Secondary | ICD-10-CM

## 2022-09-06 DIAGNOSIS — E669 Obesity, unspecified: Secondary | ICD-10-CM | POA: Diagnosis present

## 2022-09-06 DIAGNOSIS — E118 Type 2 diabetes mellitus with unspecified complications: Secondary | ICD-10-CM | POA: Diagnosis present

## 2022-09-06 DIAGNOSIS — Z66 Do not resuscitate: Secondary | ICD-10-CM | POA: Diagnosis not present

## 2022-09-06 DIAGNOSIS — Z7401 Bed confinement status: Secondary | ICD-10-CM

## 2022-09-06 DIAGNOSIS — E878 Other disorders of electrolyte and fluid balance, not elsewhere classified: Secondary | ICD-10-CM | POA: Diagnosis present

## 2022-09-06 DIAGNOSIS — E43 Unspecified severe protein-calorie malnutrition: Secondary | ICD-10-CM | POA: Diagnosis present

## 2022-09-06 DIAGNOSIS — K859 Acute pancreatitis without necrosis or infection, unspecified: Secondary | ICD-10-CM

## 2022-09-06 DIAGNOSIS — E870 Hyperosmolality and hypernatremia: Secondary | ICD-10-CM | POA: Diagnosis present

## 2022-09-06 DIAGNOSIS — N179 Acute kidney failure, unspecified: Secondary | ICD-10-CM | POA: Diagnosis present

## 2022-09-06 DIAGNOSIS — R54 Age-related physical debility: Secondary | ICD-10-CM | POA: Diagnosis present

## 2022-09-06 DIAGNOSIS — Z9221 Personal history of antineoplastic chemotherapy: Secondary | ICD-10-CM

## 2022-09-06 DIAGNOSIS — G9341 Metabolic encephalopathy: Secondary | ICD-10-CM | POA: Diagnosis not present

## 2022-09-06 DIAGNOSIS — L8990 Pressure ulcer of unspecified site, unspecified stage: Secondary | ICD-10-CM | POA: Diagnosis present

## 2022-09-06 DIAGNOSIS — R339 Retention of urine, unspecified: Secondary | ICD-10-CM | POA: Diagnosis not present

## 2022-09-06 DIAGNOSIS — R627 Adult failure to thrive: Secondary | ICD-10-CM | POA: Diagnosis present

## 2022-09-06 DIAGNOSIS — Z823 Family history of stroke: Secondary | ICD-10-CM

## 2022-09-06 DIAGNOSIS — D6959 Other secondary thrombocytopenia: Secondary | ICD-10-CM | POA: Diagnosis present

## 2022-09-06 DIAGNOSIS — Z833 Family history of diabetes mellitus: Secondary | ICD-10-CM

## 2022-09-06 DIAGNOSIS — L89152 Pressure ulcer of sacral region, stage 2: Secondary | ICD-10-CM | POA: Diagnosis present

## 2022-09-06 DIAGNOSIS — D630 Anemia in neoplastic disease: Secondary | ICD-10-CM | POA: Diagnosis present

## 2022-09-06 DIAGNOSIS — R319 Hematuria, unspecified: Secondary | ICD-10-CM | POA: Diagnosis present

## 2022-09-06 DIAGNOSIS — C61 Malignant neoplasm of prostate: Secondary | ICD-10-CM | POA: Diagnosis present

## 2022-09-06 DIAGNOSIS — Z8249 Family history of ischemic heart disease and other diseases of the circulatory system: Secondary | ICD-10-CM

## 2022-09-06 DIAGNOSIS — Z8673 Personal history of transient ischemic attack (TIA), and cerebral infarction without residual deficits: Secondary | ICD-10-CM

## 2022-09-06 DIAGNOSIS — E785 Hyperlipidemia, unspecified: Secondary | ICD-10-CM | POA: Diagnosis present

## 2022-09-06 LAB — URINALYSIS, ROUTINE W REFLEX MICROSCOPIC
Bilirubin Urine: NEGATIVE
Glucose, UA: >=500 mg/dL — AB
Ketones, ur: NEGATIVE mg/dL
Leukocytes,Ua: NEGATIVE
Nitrite: NEGATIVE
Protein, ur: 100 mg/dL — AB
Specific Gravity, Urine: 1.02 (ref 1.005–1.030)
pH: 5 (ref 5.0–8.0)

## 2022-09-06 LAB — CBC WITH DIFFERENTIAL/PLATELET
Abs Immature Granulocytes: 0.23 10*3/uL — ABNORMAL HIGH (ref 0.00–0.07)
Basophils Absolute: 0 10*3/uL (ref 0.0–0.1)
Basophils Relative: 1 %
Eosinophils Absolute: 0 10*3/uL (ref 0.0–0.5)
Eosinophils Relative: 1 %
HCT: 22.4 % — ABNORMAL LOW (ref 39.0–52.0)
Hemoglobin: 6.3 g/dL — CL (ref 13.0–17.0)
Immature Granulocytes: 6 %
Lymphocytes Relative: 16 %
Lymphs Abs: 0.6 10*3/uL — ABNORMAL LOW (ref 0.7–4.0)
MCH: 26.4 pg (ref 26.0–34.0)
MCHC: 28.1 g/dL — ABNORMAL LOW (ref 30.0–36.0)
MCV: 93.7 fL (ref 80.0–100.0)
Monocytes Absolute: 0.2 10*3/uL (ref 0.1–1.0)
Monocytes Relative: 4 %
Neutro Abs: 3 10*3/uL (ref 1.7–7.7)
Neutrophils Relative %: 72 %
Platelets: 106 10*3/uL — ABNORMAL LOW (ref 150–400)
RBC: 2.39 MIL/uL — ABNORMAL LOW (ref 4.22–5.81)
RDW: 20.3 % — ABNORMAL HIGH (ref 11.5–15.5)
WBC: 4.1 10*3/uL (ref 4.0–10.5)
nRBC: 9.9 % — ABNORMAL HIGH (ref 0.0–0.2)

## 2022-09-06 LAB — LACTIC ACID, PLASMA
Lactic Acid, Venous: 2.5 mmol/L (ref 0.5–1.9)
Lactic Acid, Venous: 2.5 mmol/L (ref 0.5–1.9)

## 2022-09-06 LAB — COMPREHENSIVE METABOLIC PANEL
ALT: 14 U/L (ref 0–44)
AST: 78 U/L — ABNORMAL HIGH (ref 15–41)
Albumin: 2.1 g/dL — ABNORMAL LOW (ref 3.5–5.0)
Alkaline Phosphatase: 1683 U/L — ABNORMAL HIGH (ref 38–126)
Anion gap: 14 (ref 5–15)
BUN: 113 mg/dL — ABNORMAL HIGH (ref 8–23)
CO2: 21 mmol/L — ABNORMAL LOW (ref 22–32)
Calcium: 8 mg/dL — ABNORMAL LOW (ref 8.9–10.3)
Chloride: 112 mmol/L — ABNORMAL HIGH (ref 98–111)
Creatinine, Ser: 2.71 mg/dL — ABNORMAL HIGH (ref 0.61–1.24)
GFR, Estimated: 25 mL/min — ABNORMAL LOW (ref >60)
Glucose, Bld: 412 mg/dL — ABNORMAL HIGH (ref 70–99)
Potassium: 3.4 mmol/L — ABNORMAL LOW (ref 3.5–5.1)
Sodium: 147 mmol/L — ABNORMAL HIGH (ref 135–145)
Total Bilirubin: 1.2 mg/dL (ref 0.3–1.2)
Total Protein: 6.3 g/dL — ABNORMAL LOW (ref 6.5–8.1)

## 2022-09-06 LAB — LIPASE, BLOOD: Lipase: 1345 U/L — ABNORMAL HIGH (ref 11–51)

## 2022-09-06 MED ORDER — FENTANYL CITRATE PF 50 MCG/ML IJ SOSY
50.0000 ug | PREFILLED_SYRINGE | Freq: Once | INTRAMUSCULAR | Status: AC
  Administered 2022-09-06: 50 ug via INTRAVENOUS
  Filled 2022-09-06: qty 1

## 2022-09-06 MED ORDER — INSULIN ASPART 100 UNIT/ML IJ SOLN
10.0000 [IU] | Freq: Once | INTRAMUSCULAR | Status: AC
  Administered 2022-09-07: 10 [IU] via SUBCUTANEOUS
  Filled 2022-09-06: qty 0.1

## 2022-09-06 MED ORDER — SODIUM CHLORIDE 0.9 % IV SOLN: 10.0000 mL/h | Freq: Once | INTRAVENOUS | Status: DC

## 2022-09-06 MED ORDER — SODIUM CHLORIDE 0.9 % IV BOLUS: 1000.0000 mL | Freq: Once | INTRAVENOUS | Status: DC

## 2022-09-06 NOTE — ED Notes (Signed)
Bair hugger applied.

## 2022-09-06 NOTE — ED Notes (Signed)
Bear Hugger was reapplied after being removed by pt's wife d/t complaints of being to hot. Discussed importance of keeping bear hugger on. Pt agreeable to keeping bear hugger on while on low setting. Son currently at bedside.

## 2022-09-06 NOTE — ED Triage Notes (Signed)
Pt comes from home via [facility] has not been eating or drinking for for the past 3 weeks, hx of prostate cancer, initial BP was 89/56, c/o of R hip pain.

## 2022-09-06 NOTE — ED Notes (Signed)
Patient transported to CT 

## 2022-09-06 NOTE — ED Provider Notes (Incomplete)
Michael Stephenson Provider Note   CSN: [id number] Arrival date & time: 09/17/2022  1921     History {Add pertinent medical, surgical, social history, OB history to HPI:1} Chief Complaint  Patient presents with   Dizziness    Michael Stephenson is a 66 y.o. male.  Patient is a 66 year old male presenting from home for decreased functional status.  Patient's son is at the bedside who states has been taking care of him.  States he has a history of prostate cancer and has not been eating or drinking for the past 3 weeks.  Admits to weight loss.  Admits to decreased blood pressure readings today.  Patient also complains of right-sided hip pain.  Denies sick contacts, uri symptoms, fevers, chills, nausea, vomiting, diarrhea.  Initial hemoglobin 5.  Patient denies any active bleeding at this time.  Son at the bedside does report seeing gross blood in his urine last week.  Otherwise denies any rectal bleeding, bright red blood in stool, or melena.  Denies a prior history of GI bleeds.  Denies a prior history of blood transfusion.  The history is provided by the patient. No language interpreter was used.       Home Medications Prior to Admission medications   Medication Sig Start Date End Date Taking? Authorizing Provider  abiraterone acetate (ZYTIGA) 250 MG tablet Take 4 tablets (1,000 mg total) by mouth daily. Take on an empty stomach 1 hour before or 2 hours after a meal 09/17/20   Wyatt Portela, MD  amLODipine (NORVASC) 5 MG tablet TAKE 1 TABLET BY MOUTH EVERYDAY AT BEDTIME 11/16/20   Wyatt Portela, MD  blood glucose meter kit and supplies Dispense based on patient and insurance preference. Use up to four times daily as directed. (FOR ICD-9 250.00, 250.01). 04/05/15   Regalado, Belkys A, MD  calcium-vitamin D (OSCAL WITH D) 500-200 MG-UNIT tablet Take 1 tablet by mouth 2 (two) times daily. 11/11/19   Wyatt Portela, MD  gabapentin (NEURONTIN) 100 MG capsule  Take 1 capsule (100 mg total) by mouth 3 (three) times daily. 09/05/19   Autry-Lott, Naaman Plummer, DO  metFORMIN (GLUCOPHAGE) 1000 MG tablet Take 1 tablet (1,000 mg total) by mouth 2 (two) times daily with a meal. 03/21/19   Nuala Alpha, MD  metFORMIN (GLUCOPHAGE-XR) 500 MG 24 hr tablet Take 2 tablets (1,000 mg total) by mouth daily with breakfast. 10/09/19   Winfrey, Alcario Drought, MD  oxyCODONE (OXY IR/ROXICODONE) 5 MG immediate release tablet Take 1 tablet (5 mg total) by mouth every 4 (four) hours as needed for severe pain. 11/11/19   Wyatt Portela, MD  sildenafil (VIAGRA) 100 MG tablet Take 0.5-1 tablets (50-100 mg total) by mouth daily as needed for erectile dysfunction. 04/28/16   McKeag, Marylynn Pearson, MD  tadalafil (ADCIRCA/CIALIS) 20 MG tablet Take 0.5-1 tablets (10-20 mg total) by mouth every other day as needed for erectile dysfunction. 03/21/19   Nuala Alpha, MD  atorvastatin (LIPITOR) 40 MG tablet Take 1 tablet (40 mg total) by mouth daily. 01/02/19 07/09/19  Harriet Butte, DO      Allergies    Patient has no known allergies.    Review of Systems   Review of Systems  Constitutional:  Negative for chills and fever.  HENT:  Negative for ear pain and sore throat.   Eyes:  Negative for pain and visual disturbance.  Respiratory:  Negative for cough and shortness of breath.   Cardiovascular:  Negative for chest pain  and palpitations.  Gastrointestinal:  Negative for abdominal pain and vomiting.  Genitourinary:  Positive for hematuria. Negative for dysuria.  Musculoskeletal:  Negative for arthralgias and back pain.  Skin:  Negative for color change and rash.  Neurological:  Negative for seizures and syncope.  All other systems reviewed and are negative.   Physical Exam Updated Vital Signs BP 134/72   Pulse (!) 104   Temp (!) 92.2 F (33.4 C) (Rectal)   Resp 20   SpO2 100%  Physical Exam Vitals and nursing note reviewed.  Constitutional:      General: He is not in acute distress.     Appearance: He is well-developed. He is cachectic.  HENT:     Head: Normocephalic and atraumatic.  Eyes:     Conjunctiva/sclera: Conjunctivae normal.  Cardiovascular:     Rate and Rhythm: Normal rate and regular rhythm.     Heart sounds: No murmur heard. Pulmonary:     Effort: Pulmonary effort is normal. No respiratory distress.     Breath sounds: Normal breath sounds.  Abdominal:     Palpations: Abdomen is soft.     Tenderness: There is no abdominal tenderness.  Musculoskeletal:        General: No swelling.     Cervical back: Neck supple.  Skin:    Capillary Refill: Capillary refill takes less than 2 seconds.     Comments: Patient cold to the touch  Neurological:     Mental Status: He is alert.  Psychiatric:        Mood and Affect: Mood normal.     ED Results / Procedures / Treatments   Labs (all labs ordered are listed, but only abnormal results are displayed) Labs Reviewed  CBC WITH DIFFERENTIAL/PLATELET - Abnormal; Notable for the following components:      Result Value   RBC 2.39 (*)    Hemoglobin 6.3 (*)    HCT 22.4 (*)    MCHC 28.1 (*)    RDW 20.3 (*)    Platelets 106 (*)    nRBC 9.9 (*)    Lymphs Abs 0.6 (*)    Abs Immature Granulocytes 0.23 (*)    All other components within normal limits  COMPREHENSIVE METABOLIC PANEL - Abnormal; Notable for the following components:   Sodium 147 (*)    Potassium 3.4 (*)    Chloride 112 (*)    CO2 21 (*)    Glucose, Bld 412 (*)    BUN 113 (*)    Creatinine, Ser 2.71 (*)    Calcium 8.0 (*)    Total Protein 6.3 (*)    Albumin 2.1 (*)    AST 78 (*)    Alkaline Phosphatase 1,683 (*)    GFR, Estimated 25 (*)    All other components within normal limits  LIPASE, BLOOD - Abnormal; Notable for the following components:   Lipase 1,345 (*)    All other components within normal limits  LACTIC ACID, PLASMA - Abnormal; Notable for the following components:   Lactic Acid, Venous 2.5 (*)    All other components within normal  limits  LACTIC ACID, PLASMA - Abnormal; Notable for the following components:   Lactic Acid, Venous 2.5 (*)    All other components within normal limits  URINALYSIS, ROUTINE W REFLEX MICROSCOPIC - Abnormal; Notable for the following components:   Color, Urine AMBER (*)    APPearance HAZY (*)    Glucose, UA >=500 (*)    Hgb urine dipstick SMALL (*)  Protein, ur 100 (*)    Bacteria, UA RARE (*)    All other components within normal limits  CULTURE, BLOOD (ROUTINE X 2)  CULTURE, BLOOD (ROUTINE X 2)  PREPARE RBC (CROSSMATCH)  TYPE AND SCREEN    EKG None  Radiology DG Chest Portable 1 View  Result Date: 08/27/2022 CLINICAL DATA:  Hypothermia EXAM: PORTABLE CHEST 1 VIEW COMPARISON:  07/03/2022 FINDINGS: Heart and mediastinal contours are within normal limits. No focal opacities or effusions. No acute bony abnormality. IMPRESSION: No active disease. Electronically Signed   By: Rolm Baptise M.D.   On: 09/05/2022 20:22    Procedures Procedures  {Document cardiac monitor, telemetry assessment procedure when appropriate:1}  Medications Ordered in ED Medications  sodium chloride 0.9 % bolus 1,000 mL (1,000 mLs Intravenous Not Given 08/27/2022 2110)  0.9 %  sodium chloride infusion (0 mL/hr Intravenous Hold 09/05/2022 2120)  insulin aspart (novoLOG) injection 10 Units (has no administration in time range)  fentaNYL (SUBLIMAZE) injection 50 mcg (50 mcg Intravenous Given 08/25/2022 2229)    ED Course/ Medical Decision Making/ A&P                           Medical Decision Making Amount and/or Complexity of Data Reviewed Labs: ordered. Radiology: ordered.  Risk Prescription drug management.  11:45 PM Patient is a 66 year old male presenting from home for decreased functional status.  Patient is alert and oriented x3, hypothermic at 92.2 F, otherwise stable vital signs.  Concerns for sepsis.  Blood cultures and lactic acid ordered.  Broad-spectrum antibiotics ordered.  Bear hugger  ordered for hypothermia.  Laboratory studies demonstrate anemia.  2 units packed red blood cells ordered.  Will transfuse with fluid warmer to help with hypothermia.  Laboratory studies also concerning for AKI.  No urinary tract infection.  Is hyperglycemic.  Insulin given. No DKA.   Lipase in the 1000s. CT scan demonstrates   {Document critical care time when appropriate:1} {Document review of labs and clinical decision tools ie heart score, Chads2Vasc2 etc:1}  {Document your independent review of radiology images, and any outside records:1} {Document your discussion with family members, caretakers, and with consultants:1} {Document social determinants of health affecting pt's care:1} {Document your decision making why or why not admission, treatments were needed:1} Final Clinical Impression(s) / ED Diagnoses Final diagnoses:  Anemia, unspecified type  Elevated lipase  AKI (acute kidney injury) ([place])  Hyperglycemia    Rx / DC Orders ED Discharge Orders     None

## 2022-09-06 NOTE — ED Notes (Signed)
Warm blankets applied as pt took of bear hugger

## 2022-09-06 NOTE — ED Notes (Signed)
XR at bedside

## 2022-09-07 ENCOUNTER — Encounter (HOSPITAL_COMMUNITY): Payer: Self-pay | Admitting: Internal Medicine

## 2022-09-07 DIAGNOSIS — Z8249 Family history of ischemic heart disease and other diseases of the circulatory system: Secondary | ICD-10-CM | POA: Diagnosis not present

## 2022-09-07 DIAGNOSIS — K852 Alcohol induced acute pancreatitis without necrosis or infection: Secondary | ICD-10-CM | POA: Diagnosis present

## 2022-09-07 DIAGNOSIS — C61 Malignant neoplasm of prostate: Secondary | ICD-10-CM | POA: Diagnosis present

## 2022-09-07 DIAGNOSIS — Z7189 Other specified counseling: Secondary | ICD-10-CM | POA: Diagnosis not present

## 2022-09-07 DIAGNOSIS — N179 Acute kidney failure, unspecified: Secondary | ICD-10-CM | POA: Diagnosis present

## 2022-09-07 DIAGNOSIS — C7951 Secondary malignant neoplasm of bone: Secondary | ICD-10-CM

## 2022-09-07 DIAGNOSIS — D630 Anemia in neoplastic disease: Secondary | ICD-10-CM | POA: Diagnosis present

## 2022-09-07 DIAGNOSIS — R627 Adult failure to thrive: Secondary | ICD-10-CM | POA: Diagnosis present

## 2022-09-07 DIAGNOSIS — D649 Anemia, unspecified: Secondary | ICD-10-CM | POA: Diagnosis not present

## 2022-09-07 DIAGNOSIS — E669 Obesity, unspecified: Secondary | ICD-10-CM | POA: Diagnosis present

## 2022-09-07 DIAGNOSIS — E876 Hypokalemia: Secondary | ICD-10-CM | POA: Diagnosis present

## 2022-09-07 DIAGNOSIS — I10 Essential (primary) hypertension: Secondary | ICD-10-CM | POA: Diagnosis present

## 2022-09-07 DIAGNOSIS — Z923 Personal history of irradiation: Secondary | ICD-10-CM | POA: Diagnosis not present

## 2022-09-07 DIAGNOSIS — G9341 Metabolic encephalopathy: Secondary | ICD-10-CM | POA: Diagnosis not present

## 2022-09-07 DIAGNOSIS — Z87891 Personal history of nicotine dependence: Secondary | ICD-10-CM | POA: Diagnosis not present

## 2022-09-07 DIAGNOSIS — Z8673 Personal history of transient ischemic attack (TIA), and cerebral infarction without residual deficits: Secondary | ICD-10-CM

## 2022-09-07 DIAGNOSIS — R739 Hyperglycemia, unspecified: Secondary | ICD-10-CM | POA: Diagnosis present

## 2022-09-07 DIAGNOSIS — Z66 Do not resuscitate: Secondary | ICD-10-CM | POA: Diagnosis not present

## 2022-09-07 DIAGNOSIS — E118 Type 2 diabetes mellitus with unspecified complications: Secondary | ICD-10-CM

## 2022-09-07 DIAGNOSIS — E878 Other disorders of electrolyte and fluid balance, not elsewhere classified: Secondary | ICD-10-CM | POA: Diagnosis present

## 2022-09-07 DIAGNOSIS — E860 Dehydration: Secondary | ICD-10-CM | POA: Diagnosis present

## 2022-09-07 DIAGNOSIS — E1165 Type 2 diabetes mellitus with hyperglycemia: Secondary | ICD-10-CM | POA: Diagnosis present

## 2022-09-07 DIAGNOSIS — L89152 Pressure ulcer of sacral region, stage 2: Secondary | ICD-10-CM | POA: Diagnosis present

## 2022-09-07 DIAGNOSIS — E785 Hyperlipidemia, unspecified: Secondary | ICD-10-CM | POA: Diagnosis present

## 2022-09-07 DIAGNOSIS — E870 Hyperosmolality and hypernatremia: Secondary | ICD-10-CM | POA: Diagnosis present

## 2022-09-07 DIAGNOSIS — Z23 Encounter for immunization: Secondary | ICD-10-CM | POA: Diagnosis not present

## 2022-09-07 DIAGNOSIS — R531 Weakness: Secondary | ICD-10-CM | POA: Diagnosis not present

## 2022-09-07 DIAGNOSIS — Z515 Encounter for palliative care: Secondary | ICD-10-CM | POA: Diagnosis not present

## 2022-09-07 DIAGNOSIS — E43 Unspecified severe protein-calorie malnutrition: Secondary | ICD-10-CM | POA: Diagnosis present

## 2022-09-07 DIAGNOSIS — D6959 Other secondary thrombocytopenia: Secondary | ICD-10-CM | POA: Diagnosis present

## 2022-09-07 LAB — COMPREHENSIVE METABOLIC PANEL
ALT: 14 U/L (ref 0–44)
AST: 78 U/L — ABNORMAL HIGH (ref 15–41)
Albumin: 2 g/dL — ABNORMAL LOW (ref 3.5–5.0)
Alkaline Phosphatase: 1717 U/L — ABNORMAL HIGH (ref 38–126)
Anion gap: 15 (ref 5–15)
BUN: 113 mg/dL — ABNORMAL HIGH (ref 8–23)
CO2: 22 mmol/L (ref 22–32)
Calcium: 8.4 mg/dL — ABNORMAL LOW (ref 8.9–10.3)
Chloride: 115 mmol/L — ABNORMAL HIGH (ref 98–111)
Creatinine, Ser: 3.13 mg/dL — ABNORMAL HIGH (ref 0.61–1.24)
GFR, Estimated: 21 mL/min — ABNORMAL LOW (ref >60)
Glucose, Bld: 238 mg/dL — ABNORMAL HIGH (ref 70–99)
Potassium: 3.6 mmol/L (ref 3.5–5.1)
Sodium: 152 mmol/L — ABNORMAL HIGH (ref 135–145)
Total Bilirubin: 1.1 mg/dL (ref 0.3–1.2)
Total Protein: 6.2 g/dL — ABNORMAL LOW (ref 6.5–8.1)

## 2022-09-07 LAB — CBC
HCT: 23.3 % — ABNORMAL LOW (ref 39.0–52.0)
Hemoglobin: 6.7 g/dL — CL (ref 13.0–17.0)
MCH: 26.5 pg (ref 26.0–34.0)
MCHC: 28.8 g/dL — ABNORMAL LOW (ref 30.0–36.0)
MCV: 92.1 fL (ref 80.0–100.0)
Platelets: 100 10*3/uL — ABNORMAL LOW (ref 150–400)
RBC: 2.53 MIL/uL — ABNORMAL LOW (ref 4.22–5.81)
RDW: 19.9 % — ABNORMAL HIGH (ref 11.5–15.5)
WBC: 5.2 10*3/uL (ref 4.0–10.5)
nRBC: 10.9 % — ABNORMAL HIGH (ref 0.0–0.2)

## 2022-09-07 LAB — CBG MONITORING, ED
Glucose-Capillary: 203 mg/dL — ABNORMAL HIGH (ref 70–99)
Glucose-Capillary: 247 mg/dL — ABNORMAL HIGH (ref 70–99)
Glucose-Capillary: 340 mg/dL — ABNORMAL HIGH (ref 70–99)
Glucose-Capillary: 228 mg/dL — ABNORMAL HIGH (ref 70–99)

## 2022-09-07 LAB — HEMOGLOBIN A1C
Hgb A1c MFr Bld: 6.4 % — ABNORMAL HIGH (ref 4.8–5.6)
Mean Plasma Glucose: 136.98 mg/dL

## 2022-09-07 LAB — GLUCOSE, CAPILLARY: Glucose-Capillary: 223 mg/dL — ABNORMAL HIGH (ref 70–99)

## 2022-09-07 LAB — ABO/RH: ABO/RH(D): B POS

## 2022-09-07 LAB — IRON AND TIBC
Iron: 138 ug/dL (ref 45–182)
Saturation Ratios: 65 % — ABNORMAL HIGH (ref 17.9–39.5)
TIBC: 213 ug/dL — ABNORMAL LOW (ref 250–450)
UIBC: 75 ug/dL

## 2022-09-07 LAB — RETICULOCYTES
Immature Retic Fract: 26.7 % — ABNORMAL HIGH (ref 2.3–15.9)
RBC.: 2.5 MIL/uL — ABNORMAL LOW (ref 4.22–5.81)
Retic Count, Absolute: 75 10*3/uL (ref 19.0–186.0)
Retic Ct Pct: 3 % (ref 0.4–3.1)

## 2022-09-07 LAB — FERRITIN: Ferritin: >7500 ng/mL — ABNORMAL HIGH (ref 24–336)

## 2022-09-07 LAB — HEMOGLOBIN AND HEMATOCRIT, BLOOD
HCT: 29.7 % — ABNORMAL LOW (ref 39.0–52.0)
Hemoglobin: 9.4 g/dL — ABNORMAL LOW (ref 13.0–17.0)

## 2022-09-07 LAB — PREPARE RBC (CROSSMATCH)

## 2022-09-07 LAB — MAGNESIUM: Magnesium: 2.5 mg/dL — ABNORMAL HIGH (ref 1.7–2.4)

## 2022-09-07 MED ORDER — HYDROMORPHONE HCL 1 MG/ML IJ SOLN: 0.5000 mg | INTRAMUSCULAR | Status: DC | PRN

## 2022-09-07 MED ORDER — ACETAMINOPHEN 650 MG RE SUPP: 650.0000 mg | Freq: Four times a day (QID) | RECTAL | Status: DC | PRN

## 2022-09-07 MED ORDER — SODIUM CHLORIDE 0.9% FLUSH
3.0000 mL | Freq: Two times a day (BID) | INTRAVENOUS | Status: DC
  Administered 2022-09-07 – 2022-09-08 (×4): 3 mL via INTRAVENOUS

## 2022-09-07 MED ORDER — LACTATED RINGERS IV SOLN: INTRAVENOUS | Status: DC

## 2022-09-07 MED ORDER — ACETAMINOPHEN 325 MG PO TABS: 650.0000 mg | ORAL_TABLET | Freq: Four times a day (QID) | ORAL | Status: DC | PRN

## 2022-09-07 MED ORDER — SODIUM CHLORIDE 0.45 % IV SOLN: INTRAVENOUS | Status: DC

## 2022-09-07 MED ORDER — INFLUENZA VAC SPLIT QUAD 0.5 ML IM SUSY
0.5000 mL | PREFILLED_SYRINGE | INTRAMUSCULAR | Status: AC
  Administered 2022-09-08: 0.5 mL via INTRAMUSCULAR
  Filled 2022-09-07: qty 0.5

## 2022-09-07 MED ORDER — POLYETHYLENE GLYCOL 3350 17 G PO PACK: 17.0000 g | PACK | Freq: Every day | ORAL | Status: DC | PRN

## 2022-09-07 MED ORDER — INSULIN ASPART 100 UNIT/ML IJ SOLN
0.0000 [IU] | Freq: Three times a day (TID) | INTRAMUSCULAR | Status: DC
  Administered 2022-09-07 – 2022-09-08 (×3): 3 [IU] via SUBCUTANEOUS
  Administered 2022-09-08: 5 [IU] via SUBCUTANEOUS
  Filled 2022-09-07: qty 0.09

## 2022-09-07 MED ORDER — OXYCODONE HCL 5 MG PO TABS: 5.0000 mg | ORAL_TABLET | Freq: Four times a day (QID) | ORAL | Status: DC | PRN

## 2022-09-07 NOTE — H&P (Signed)
History and Physical   Michael Stephenson ZYS:[id number] DOB: [birth year] DOA: 09/14/2022  PCP: Pcp, No   Patient coming from: Home  Chief Complaint: General decline  HPI: Michael Stephenson is a 66 y.o. male with medical history significant of hypertension, diabetes, TIA, prostate cancer receiving chemoradiation through the [place], obesity presenting with decline at home.  Patient as above has known history of prostate cancer receiving treatment through the [place].  Currently on oral chemotherapy and has had radiation treatments in the past.  His son accompanied him and is his caregiver at home.  Patient has had decreased p.o. intake for the past 3 weeks and has had weight loss associated with this.  Has also had decreased blood pressure and has been bedbound in part due to this and his generalized weakness.  Denies any significant bleeding in his stools or dark stools.  Does report hematuria.  No other bleeding reported.  ED Course: Vital signs in ED significant for temperature initially 92.2 improving to 94 on recheck, heart rate in the 90s to 100s, respiratory rate in the teens to 20s, blood pressure in the 932T to 557D systolic.  Lab work-up included CMP with sodium 147, potassium 3.4, chloride 112, bicarb 21, BUN 113, creatinine elevated 2.7 from baseline of 1.2, glucose 412, calcium 8, protein 6.3, albumin 2.1, AST 78 and ALP 1683.  CBC with anemia given hemoglobin of 6.3 down from 11 6 months ago, platelets 106.  Delayed bodies noted on smear.  Lactic acid stable at 2.5x2.  Lipase elevated to greater than 1300.  Patient was typed and screened in the ED.  Urinalysis showed glucose, hemoglobin, protein, rare bacteria.  Chest x-ray showed no acute normality.  CT abdomen pelvis showed acute interstitial pancreatitis and questionable gallbladder changes, widespread bony mets also noted.  Patient received fentanyl, insulin, liter fluids, placed on a Bair hugger in the ED.  2 units of PRBCs also ordered and  transfusing.  Review of Systems: As per HPI otherwise all other systems reviewed and are negative.  Past Medical History:  Diagnosis Date   Anxiety    DM (diabetes mellitus) ([place])    Elevated PSA 09/24/2019   Heart murmur    HLD (hyperlipidemia)    Hypertension     Past Surgical History:  Procedure Laterality Date   LAPAROSCOPIC APPENDECTOMY  03/30/2012   Procedure: APPENDECTOMY LAPAROSCOPIC;  Surgeon: Gwenyth Ober, MD;  Location: [facility];  Service: General;  Laterality: N/A;   TONSILLECTOMY      Social History  reports that he quit smoking about 38 years ago. His smoking use included cigarettes. He has never used smokeless tobacco. He reports current drug use. Drug: Marijuana. He reports that he does not drink alcohol.  No Known Allergies  Family History  Problem Relation Age of Onset   Benign prostatic hyperplasia Father    Heart disease Mother    Kidney disease Mother    Diabetes Sister    Stroke Brother    Colon cancer Neg Hx    Esophageal cancer Neg Hx    Stomach cancer Neg Hx    Rectal cancer Neg Hx   Reviewed on admission  Prior to Admission medications   Medication Sig Start Date End Date Taking? Authorizing Provider  morphine (MS CONTIN) 15 MG 12 hr tablet Take 15 mg by mouth every 12 (twelve) hours.   Yes [provider]  oxyCODONE (OXY IR/ROXICODONE) 5 MG immediate release tablet Take 1 tablet (5 mg total) by mouth every 4 (  four) hours as needed for severe pain. 11/11/19  Yes Wyatt Portela, MD  abiraterone acetate (ZYTIGA) 250 MG tablet Take 4 tablets (1,000 mg total) by mouth daily. Take on an empty stomach 1 hour before or 2 hours after a meal Patient not taking: Reported on 09/07/2022 09/17/20   Wyatt Portela, MD  amLODipine (NORVASC) 5 MG tablet TAKE 1 TABLET BY MOUTH EVERYDAY AT BEDTIME Patient not taking: Reported on 09/07/2022 11/16/20   Wyatt Portela, MD  blood glucose meter kit and supplies Dispense based on patient and insurance  preference. Use up to four times daily as directed. (FOR ICD-9 250.00, 250.01). 04/05/15   Regalado, Belkys A, MD  calcium-vitamin D (OSCAL WITH D) 500-200 MG-UNIT tablet Take 1 tablet by mouth 2 (two) times daily. Patient not taking: Reported on 09/07/2022 11/11/19   Wyatt Portela, MD  gabapentin (NEURONTIN) 100 MG capsule Take 1 capsule (100 mg total) by mouth 3 (three) times daily. Patient not taking: Reported on 09/07/2022 09/05/19   Autry-Lott, Naaman Plummer, DO  metFORMIN (GLUCOPHAGE) 1000 MG tablet Take 1 tablet (1,000 mg total) by mouth 2 (two) times daily with a meal. Patient not taking: Reported on 09/07/2022 03/21/19   Nuala Alpha, MD  metFORMIN (GLUCOPHAGE-XR) 500 MG 24 hr tablet Take 2 tablets (1,000 mg total) by mouth daily with breakfast. Patient not taking: Reported on 09/07/2022 10/09/19   Kathrene Alu, MD  sildenafil (VIAGRA) 100 MG tablet Take 0.5-1 tablets (50-100 mg total) by mouth daily as needed for erectile dysfunction. Patient not taking: Reported on 09/07/2022 04/28/16   McKeag, Marylynn Pearson, MD  tadalafil (ADCIRCA/CIALIS) 20 MG tablet Take 0.5-1 tablets (10-20 mg total) by mouth every other day as needed for erectile dysfunction. Patient not taking: Reported on 09/07/2022 03/21/19   Nuala Alpha, MD  atorvastatin (LIPITOR) 40 MG tablet Take 1 tablet (40 mg total) by mouth daily. 01/02/19 07/09/19  Harriet Butte, DO    Physical Exam: Vitals:   09/05/2022 2348 09/13/2022 2352 09/07/22 0000 09/07/22 0006  BP: 115/73  (!) 113/90   Pulse: (!) 102 99 (!) 104   Resp: (!) 29 (!) 30 (!) 25   Temp:    (!) 94 F (34.4 C)  TempSrc:    Rectal  SpO2: 99% 99% 100%     Physical Exam Constitutional:      General: He is not in acute distress.    Comments: Thin, tired appearing, elderly male who appears older than stated age  HENT:     Head: Normocephalic and atraumatic.     Mouth/Throat:     Mouth: Mucous membranes are moist.     Pharynx: Oropharynx is clear.  Eyes:      Extraocular Movements: Extraocular movements intact.     Pupils: Pupils are equal, round, and reactive to light.  Cardiovascular:     Rate and Rhythm: Regular rhythm. Tachycardia present.     Pulses: Normal pulses.     Heart sounds: Normal heart sounds.  Pulmonary:     Effort: Pulmonary effort is normal. No respiratory distress.     Breath sounds: Normal breath sounds.  Abdominal:     General: Bowel sounds are normal. There is no distension.     Palpations: Abdomen is soft.     Tenderness: There is no abdominal tenderness.  Musculoskeletal:        General: No swelling or deformity.  Skin:    General: Skin is warm and dry.  Neurological:  General: No focal deficit present.     Mental Status: Mental status is at baseline.    Labs on Admission: I have personally reviewed following labs and imaging studies  CBC: Recent Labs  Lab 08/24/2022 1951  WBC 4.1  NEUTROABS 3.0  HGB 6.3*  HCT 22.4*  MCV 93.7  PLT 106*    Basic Metabolic Panel: Recent Labs  Lab 08/25/2022 1951  NA 147*  K 3.4*  CL 112*  CO2 21*  GLUCOSE 412*  BUN 113*  CREATININE 2.71*  CALCIUM 8.0*    GFR: CrCl cannot be calculated (Unknown ideal weight.).  Liver Function Tests: Recent Labs  Lab 08/22/2022 1951  AST 78*  ALT 14  ALKPHOS 1,683*  BILITOT 1.2  PROT 6.3*  ALBUMIN 2.1*    Urine analysis:    Component Value Date/Time   COLORURINE AMBER (A) 09/18/2022 2228   APPEARANCEUR HAZY (A) 09/04/2022 2228   LABSPEC 1.020 08/30/2022 2228   PHURINE 5.0 09/05/2022 2228   GLUCOSEU >=500 (A) 09/19/2022 2228   HGBUR SMALL (A) 09/02/2022 2228   BILIRUBINUR NEGATIVE 08/20/2022 2228   KETONESUR NEGATIVE 09/03/2022 2228   PROTEINUR 100 (A) 08/21/2022 2228   NITRITE NEGATIVE 09/12/2022 2228   LEUKOCYTESUR NEGATIVE 09/04/2022 2228    Radiological Exams on Admission: CT ABDOMEN PELVIS WO CONTRAST  Result Date: 09/03/2022 CLINICAL DATA:  Generalized abdominal pain; hematuria; elevated lipase  EXAM: CT ABDOMEN AND PELVIS WITHOUT CONTRAST TECHNIQUE: Multidetector CT imaging of the abdomen and pelvis was performed following the standard protocol without IV contrast. RADIATION DOSE REDUCTION: This exam was performed according to the departmental dose-optimization program which includes automated exposure control, adjustment of the mA and/or kV according to patient size and/or use of iterative reconstruction technique. COMPARISON:  PET/CT 02/22/2022 and CT abdomen and pelvis 11/10/2019 FINDINGS: Lower chest: No acute abnormality. Hepatobiliary: The gallbladder is distended with sludge. No definite biliary dilation. Question mild gallbladder wall thickening or pericholecystic fluid though evaluation is limited by respiratory motion. No suspicious liver lesion. Pancreas: Pancreas appears edematous with mild adjacent peripancreatic fluid and stranding. Spleen: The spleen is unremarkable. Small amount of perisplenic free fluid. Adrenals/Urinary Tract: Adrenal glands are unremarkable. Kidneys are normal, without renal calculi, focal lesion, or hydronephrosis. Bladder is unremarkable. Stomach/Bowel: Normal caliber large and small bowel. Unremarkable stomach. Mild inflammatory wall thickening about the duodenum likely reactive secondary to pancreatitis. Appendix is not visualized. Vascular/Lymphatic: Aortic atherosclerosis. No enlarged abdominal or pelvic lymph nodes. Reproductive: Unremarkable. Other: No free intraperitoneal air. Musculoskeletal: No acute abnormality. Diffuse sclerotic osseous metastases throughout the visualized axial and appendicular skeleton. IMPRESSION: Acute interstitial pancreatitis. Question gallbladder wall thickening/pericholecystic fluid. Sludge within the gallbladder. If there is concern for cholecystitis consider ultrasound for further evaluation. Widespread osseous metastases. Aortic Atherosclerosis (ICD10-I70.0). Electronically Signed   By: Placido Sou M.D.   On: 09/08/2022 23:56    DG Chest Portable 1 View  Result Date: 08/28/2022 CLINICAL DATA:  Hypothermia EXAM: PORTABLE CHEST 1 VIEW COMPARISON:  07/03/2022 FINDINGS: Heart and mediastinal contours are within normal limits. No focal opacities or effusions. No acute bony abnormality. IMPRESSION: No active disease. Electronically Signed   By: Rolm Baptise M.D.   On: 08/20/2022 20:22    EKG: Independently reviewed.  Sinus rhythm at 91 bpm.  Some baseline artifact in V3 and V6.  Low voltage in multiple leads.  QTc prolonged at 505.  Assessment/Plan Principal Problem:   Symptomatic anemia Active Problems:   Hypertension   History of TIA (transient ischemic attack)  Type 2 diabetes mellitus with complication, without long-term current use of insulin (HCC)   Prostate cancer metastatic to bone (HCC)   Obesity   Hypothermia Symptomatic anemia > Patient presenting with general decline in function in the setting of prostate cancer.  Found to have hemoglobin of 6.3 and hypothermia (initial some concern for sepsis however given his other explanation and lack of change in white count this is less likely). > Concerned that his symptoms are likely due to symptomatic anemia.  Suspect his anemia is in the setting of worsening bony metastases leading to myelosuppression. > 2 units of packed red blood cells ordered in the ED and is transfusion. - Monitor on progressive unit due to multiple issues and need for close monitoring as below - Trend CBC - Check iron studies, reticulocyte counts (Add-on)   AKI Hypokalemia Hypernatremia Uremia > Noted to have creatinine elevated 2.71 from baseline 1.2, potassium mildly low at 3.4, sodium 147, BUN 113. > This is all in the setting of minimal to no p.o. intake for the last 3 weeks given general decline in patient with known metastatic prostate cancer. > Received a liter of fluids in the ED. - Continue with IV fluids overnight through warmer concerning his hypothermia as above. - Trend  renal function and electrolytes - Check magnesium  Prostate cancer > Known history of prostate cancer currently on oral medication only.  History of chemotherapy and radiation. > Known bony metastases.  Now appears to be very widespread on CT of the abdomen pelvis. > His advancing bony metastases are likely leading to myelosuppression causing his anemia and thrombocytopenia. - Trend CBC - As needed pain control - May benefit from discussion with hematology/oncology due to multiple processes related to this underlying process  Pancreatitis > Known history of alcohol use noted to have interstitial pancreatitis on CT abdomen pelvis with lipase elevated to greater than 1300.  Minimal abdominal pain. - Continue with IV fluids as above - Continue home pain medication as above  Hyperglycemia Diabetes > Glucose of 412 initially in the ED on labs.  Not confirmed on CBG. > Received 10 units of insulin in the ED - SSI  Hypertension - Not currently taking any home antihypertensives, not significantly hypertensive here  History of TIA History of obesity - Noted  DVT prophylaxis: SCDs for now Code Status:   Full Family Communication:  Updated at bedside Disposition Plan:   Patient is from:  Home  Anticipated DC to:  Home  Anticipated DC date:  1 to 4 days  Anticipated DC barriers: None  Consults called:  None Admission status:  Observation, progressive  Severity of Illness: The appropriate patient status for this patient is OBSERVATION. Observation status is judged to be reasonable and necessary in order to provide the required intensity of service to ensure the patient's safety. The patient's presenting symptoms, physical exam findings, and initial radiographic and laboratory data in the context of their medical condition is felt to place them at decreased risk for further clinical deterioration. Furthermore, it is anticipated that the patient will be medically stable for discharge from  the hospital within 2 midnights of admission.    Marcelyn Bruins MD [facility]  How to contact the [facility] Attending or Consulting provider [place] or covering provider during after hours [place], for this patient?   Check the care team in [facility] and look for a) attending/consulting [facility] provider listed and b) the [facility] team listed Log into [email or web address] and use  [place]'s universal password to access. If you do not have the password, please contact the hospital operator. Locate the [facility] provider you are looking for under [facility] and page to a number that you can be directly reached. If you still have difficulty reaching the provider, please page the [facility] (Director on Call) for the Hospitalists listed on amion for assistance.  09/07/2022, 12:57 AM

## 2022-09-07 NOTE — ED Notes (Signed)
Pt Michael Stephenson is departing at this time. Asks to be called at [phone] with any updates.

## 2022-09-07 NOTE — Progress Notes (Signed)
Patient seen and examined in the emergency room waiting for inpatient bed.  He was admitted by nighttime hospitalist early morning hours.  Carries a complicated medical history as below.  66 year old gentleman with history of essential hypertension, type 2 diabetes, metastatic prostate cancer receiving oral chemotherapy, currently declining clinical status brought to the emergency room with decreasing oral intake, weight loss, decreasing blood pressures and bedbound with profound weakness.  Lives at home with wife and son helps.  In the emergency room initial temperature 92.2, heart rate 100, blood pressures normal. Sodium 147, potassium 3.4, creatinine 2.7 with recent creatinine of 1.2.  Glucose 412.  ALP 1683.  Hemoglobin 6.3 with recent hemoglobin of 11.6 about a month ago.  Lipase greater than 1300.  Urine with glucose and hemoglobin.  Chest x-ray normal.  CT scan abdomen pelvis with acute interstitial pancreatitis and questionable gallbladder changes, widespread bony metastatic lesions.  Patient was treated with bear hugger, 2 units of PRBC transfusion started, received fentanyl insulin and fluid boluses.  Admitted due to significant symptoms.     Symptomatic anemia with hemoglobin 6.3, probably due to bone marrow suppression from widespread bony metastasis. Iron studies with normal iron, increased ferritin.  No evidence of iron-deficiency anemia. No evidence of active bleeding. 2 units of PRBC transfusion ongoing, will recheck and transfuse further if needed.  Patient is not stable to undergo any procedures.  Environmental hypothermia, severe dehydration with hyperchloremic hypernatremia, acute renal failure secondary to above: Still inadequately resuscitated.  2 L Ringer lactate bolus now.  Intake output monitoring.  No evidence of urinary obstruction. Start clears, continue high-dose IV fluids and monitor levels.  Failure to thrive, metastatic prostate cancer castrate resistant, severe  protein calorie malnutrition and severe dehydration: Patient probably at the end of life. He was previously recommended to hospice by his oncologist at [facility], notes reviewed at care everywhere. Patient is appropriate for end-of-life care and hospice.  Patient could not make any decisions or answer any questions. Called patient's son and patient's wife to discuss, unable to reach. We will consult palliative care team to facilitate discussion.  Type 2 diabetes with hyperglycemia: Keep on low-dose sliding scale insulin with poor appetite.    Total time spent: 40 minutes.  Same-day admission.  No charge visit.  Admit to progressive care unit due to higher level of medical care needed.

## 2022-09-07 NOTE — ED Notes (Signed)
Pt unwilling to keep bear hugger on. Provided with additional warm blankets and room temp increased. Pt and Son at bedside informed of interventions and importance to warm pt.

## 2022-09-07 NOTE — ED Notes (Signed)
Assumed care of patient at this time. Upon entering rm, pt had removed gown and blankets, stating he was too hot at this time. Linen changed, pt assisted back into gown. Explained to patient reasoning for warming. Pt denies any pain at this time. Able to answer questions, but slow to respond. Blood transfusion currently running. No s/sx of acute distress at this time.

## 2022-09-07 NOTE — ED Provider Notes (Incomplete)
[facility] DEPT Provider Note   CSN: [id number] Arrival date & time: 08/22/2022  1921     History {Add pertinent medical, surgical, social history, OB history to HPI:1} Chief Complaint  Patient presents with  . Dizziness    Michael Stephenson is a 66 y.o. male.  Patient is a 66 year old male presenting from home for decreased functional status.  Patient's son is at the bedside who states has been taking care of him.  States he has a history of prostate cancer and has not been eating or drinking for the past 3 weeks.  Admits to weight loss.  Admits to decreased blood pressure readings today.  Patient also complains of right-sided hip pain.  Denies sick contacts, uri symptoms, fevers, chills, nausea, vomiting, diarrhea.  Initial hemoglobin 5.  Patient denies any active bleeding at this time.  Son at the bedside does report seeing gross blood in his urine last week.  Otherwise denies any rectal bleeding, bright red blood in stool, or melena.  Denies a prior history of GI bleeds.  Denies a prior history of blood transfusion.  The history is provided by the patient. No language interpreter was used.       Home Medications Prior to Admission medications   Medication Sig Start Date End Date Taking? Authorizing Provider  abiraterone acetate (ZYTIGA) 250 MG tablet Take 4 tablets (1,000 mg total) by mouth daily. Take on an empty stomach 1 hour before or 2 hours after a meal 09/17/20   Wyatt Portela, MD  amLODipine (NORVASC) 5 MG tablet TAKE 1 TABLET BY MOUTH EVERYDAY AT BEDTIME 11/16/20   Wyatt Portela, MD  blood glucose meter kit and supplies Dispense based on patient and insurance preference. Use up to four times daily as directed. (FOR ICD-9 250.00, 250.01). 04/05/15   Regalado, Belkys A, MD  calcium-vitamin D (OSCAL WITH D) 500-200 MG-UNIT tablet Take 1 tablet by mouth 2 (two) times daily. 11/11/19   Wyatt Portela, MD  gabapentin (NEURONTIN) 100 MG capsule  Take 1 capsule (100 mg total) by mouth 3 (three) times daily. 09/05/19   Autry-Lott, Naaman Plummer, DO  metFORMIN (GLUCOPHAGE) 1000 MG tablet Take 1 tablet (1,000 mg total) by mouth 2 (two) times daily with a meal. 03/21/19   Nuala Alpha, MD  metFORMIN (GLUCOPHAGE-XR) 500 MG 24 hr tablet Take 2 tablets (1,000 mg total) by mouth daily with breakfast. 10/09/19   Winfrey, Alcario Drought, MD  oxyCODONE (OXY IR/ROXICODONE) 5 MG immediate release tablet Take 1 tablet (5 mg total) by mouth every 4 (four) hours as needed for severe pain. 11/11/19   Wyatt Portela, MD  sildenafil (VIAGRA) 100 MG tablet Take 0.5-1 tablets (50-100 mg total) by mouth daily as needed for erectile dysfunction. 04/28/16   McKeag, Marylynn Pearson, MD  tadalafil (ADCIRCA/CIALIS) 20 MG tablet Take 0.5-1 tablets (10-20 mg total) by mouth every other day as needed for erectile dysfunction. 03/21/19   Nuala Alpha, MD  atorvastatin (LIPITOR) 40 MG tablet Take 1 tablet (40 mg total) by mouth daily. 01/02/19 07/09/19  Harriet Butte, DO      Allergies    Patient has no known allergies.    Review of Systems   Review of Systems  Constitutional:  Negative for chills and fever.  HENT:  Negative for ear pain and sore throat.   Eyes:  Negative for pain and visual disturbance.  Respiratory:  Negative for cough and shortness of breath.   Cardiovascular:  Negative for chest pain  and palpitations.  Gastrointestinal:  Negative for abdominal pain and vomiting.  Genitourinary:  Positive for hematuria. Negative for dysuria.  Musculoskeletal:  Negative for arthralgias and back pain.  Skin:  Negative for color change and rash.  Neurological:  Negative for seizures and syncope.  All other systems reviewed and are negative.   Physical Exam Updated Vital Signs BP 115/73   Pulse 99   Temp (!) 92.2 F (33.4 C) (Rectal)   Resp (!) 30   SpO2 99%  Physical Exam Vitals and nursing note reviewed.  Constitutional:      General: He is not in acute distress.     Appearance: He is well-developed. He is cachectic.  HENT:     Head: Normocephalic and atraumatic.  Eyes:     Conjunctiva/sclera: Conjunctivae normal.  Cardiovascular:     Rate and Rhythm: Normal rate and regular rhythm.     Heart sounds: No murmur heard. Pulmonary:     Effort: Pulmonary effort is normal. No respiratory distress.     Breath sounds: Normal breath sounds.  Abdominal:     Palpations: Abdomen is soft.     Tenderness: There is no abdominal tenderness.  Musculoskeletal:        General: No swelling.     Cervical back: Neck supple.  Skin:    Capillary Refill: Capillary refill takes less than 2 seconds.     Comments: Patient cold to the touch  Neurological:     Mental Status: He is alert.  Psychiatric:        Mood and Affect: Mood normal.     ED Results / Procedures / Treatments   Labs (all labs ordered are listed, but only abnormal results are displayed) Labs Reviewed  CBC WITH DIFFERENTIAL/PLATELET - Abnormal; Notable for the following components:      Result Value   RBC 2.39 (*)    Hemoglobin 6.3 (*)    HCT 22.4 (*)    MCHC 28.1 (*)    RDW 20.3 (*)    Platelets 106 (*)    nRBC 9.9 (*)    Lymphs Abs 0.6 (*)    Abs Immature Granulocytes 0.23 (*)    All other components within normal limits  COMPREHENSIVE METABOLIC PANEL - Abnormal; Notable for the following components:   Sodium 147 (*)    Potassium 3.4 (*)    Chloride 112 (*)    CO2 21 (*)    Glucose, Bld 412 (*)    BUN 113 (*)    Creatinine, Ser 2.71 (*)    Calcium 8.0 (*)    Total Protein 6.3 (*)    Albumin 2.1 (*)    AST 78 (*)    Alkaline Phosphatase 1,683 (*)    GFR, Estimated 25 (*)    All other components within normal limits  LIPASE, BLOOD - Abnormal; Notable for the following components:   Lipase 1,345 (*)    All other components within normal limits  LACTIC ACID, PLASMA - Abnormal; Notable for the following components:   Lactic Acid, Venous 2.5 (*)    All other components within normal  limits  LACTIC ACID, PLASMA - Abnormal; Notable for the following components:   Lactic Acid, Venous 2.5 (*)    All other components within normal limits  URINALYSIS, ROUTINE W REFLEX MICROSCOPIC - Abnormal; Notable for the following components:   Color, Urine AMBER (*)    APPearance HAZY (*)    Glucose, UA >=500 (*)    Hgb urine dipstick SMALL (*)  Protein, ur 100 (*)    Bacteria, UA RARE (*)    All other components within normal limits  CULTURE, BLOOD (ROUTINE X 2)  CULTURE, BLOOD (ROUTINE X 2)  PREPARE RBC (CROSSMATCH)  TYPE AND SCREEN    EKG None  Radiology CT ABDOMEN PELVIS WO CONTRAST  Result Date: 09/05/2022 CLINICAL DATA:  Generalized abdominal pain; hematuria; elevated lipase EXAM: CT ABDOMEN AND PELVIS WITHOUT CONTRAST TECHNIQUE: Multidetector CT imaging of the abdomen and pelvis was performed following the standard protocol without IV contrast. RADIATION DOSE REDUCTION: This exam was performed according to the departmental dose-optimization program which includes automated exposure control, adjustment of the mA and/or kV according to patient size and/or use of iterative reconstruction technique. COMPARISON:  PET/CT 02/22/2022 and CT abdomen and pelvis 11/10/2019 FINDINGS: Lower chest: No acute abnormality. Hepatobiliary: The gallbladder is distended with sludge. No definite biliary dilation. Question mild gallbladder wall thickening or pericholecystic fluid though evaluation is limited by respiratory motion. No suspicious liver lesion. Pancreas: Pancreas appears edematous with mild adjacent peripancreatic fluid and stranding. Spleen: The spleen is unremarkable. Small amount of perisplenic free fluid. Adrenals/Urinary Tract: Adrenal glands are unremarkable. Kidneys are normal, without renal calculi, focal lesion, or hydronephrosis. Bladder is unremarkable. Stomach/Bowel: Normal caliber large and small bowel. Unremarkable stomach. Mild inflammatory wall thickening about the  duodenum likely reactive secondary to pancreatitis. Appendix is not visualized. Vascular/Lymphatic: Aortic atherosclerosis. No enlarged abdominal or pelvic lymph nodes. Reproductive: Unremarkable. Other: No free intraperitoneal air. Musculoskeletal: No acute abnormality. Diffuse sclerotic osseous metastases throughout the visualized axial and appendicular skeleton. IMPRESSION: Acute interstitial pancreatitis. Question gallbladder wall thickening/pericholecystic fluid. Sludge within the gallbladder. If there is concern for cholecystitis consider ultrasound for further evaluation. Widespread osseous metastases. Aortic Atherosclerosis (ICD10-I70.0). Electronically Signed   By: Placido Sou M.D.   On: 08/26/2022 23:56   DG Chest Portable 1 View  Result Date: 09/16/2022 CLINICAL DATA:  Hypothermia EXAM: PORTABLE CHEST 1 VIEW COMPARISON:  07/03/2022 FINDINGS: Heart and mediastinal contours are within normal limits. No focal opacities or effusions. No acute bony abnormality. IMPRESSION: No active disease. Electronically Signed   By: Rolm Baptise M.D.   On: 09/05/2022 20:22    Procedures Procedures  {Document cardiac monitor, telemetry assessment procedure when appropriate:1}  Medications Ordered in ED Medications  sodium chloride 0.9 % bolus 1,000 mL (1,000 mLs Intravenous Not Given 08/23/2022 2110)  0.9 %  sodium chloride infusion (0 mL/hr Intravenous Hold 09/10/2022 2120)  insulin aspart (novoLOG) injection 10 Units (has no administration in time range)  fentaNYL (SUBLIMAZE) injection 50 mcg (50 mcg Intravenous Given 08/25/2022 2229)    ED Course/ Medical Decision Making/ A&P                           Medical Decision Making Amount and/or Complexity of Data Reviewed Labs: ordered. Radiology: ordered.  Risk Prescription drug management.  12:01 AM Patient is a 66 year old male presenting from home for decreased functional status.  Patient is alert and oriented x3, hypothermic at 92.2 F,  otherwise stable vital signs.  Concerns for sepsis.  Blood cultures and lactic acid ordered.  Broad-spectrum antibiotics ordered.  Bear hugger ordered for hypothermia.  Laboratory studies demonstrate anemia.  2 units packed red blood cells ordered.  Will transfuse with fluid warmer to help with hypothermia.  History of prostate cancer.  Laboratory studies also concerning for AKI.  No urinary tract infection.   Patient is also hyperglycemic.  History of diabetes  myelitis.  Insulin given. No DKA.   Lipase in the 1000s. CT scan demonstrates acute cryptitis.  Patient admits to frequent alcohol use.  No withdrawal symptoms at this time.  No abdominal complaints at this time.   {Document critical care time when appropriate:1} {Document review of labs and clinical decision tools ie heart score, Chads2Vasc2 etc:1}  {Document your independent review of radiology images, and any outside records:1} {Document your discussion with family members, caretakers, and with consultants:1} {Document social determinants of health affecting pt's care:1} {Document your decision making why or why not admission, treatments were needed:1} Final Clinical Impression(s) / ED Diagnoses Final diagnoses:  Anemia, unspecified type  Elevated lipase  AKI (acute kidney injury) ([place])  Hyperglycemia  Acute pancreatitis, unspecified complication status, unspecified pancreatitis type    Rx / DC Orders ED Discharge Orders     None

## 2022-09-07 NOTE — ED Notes (Signed)
MD at bedside. 

## 2022-09-08 DIAGNOSIS — C7951 Secondary malignant neoplasm of bone: Secondary | ICD-10-CM | POA: Diagnosis not present

## 2022-09-08 DIAGNOSIS — L8990 Pressure ulcer of unspecified site, unspecified stage: Secondary | ICD-10-CM | POA: Diagnosis present

## 2022-09-08 DIAGNOSIS — Z7189 Other specified counseling: Secondary | ICD-10-CM

## 2022-09-08 DIAGNOSIS — Z515 Encounter for palliative care: Secondary | ICD-10-CM

## 2022-09-08 DIAGNOSIS — R531 Weakness: Secondary | ICD-10-CM

## 2022-09-08 DIAGNOSIS — D649 Anemia, unspecified: Secondary | ICD-10-CM | POA: Diagnosis not present

## 2022-09-08 LAB — TYPE AND SCREEN
ABO/RH(D): B POS
Antibody Screen: NEGATIVE
Unit division: 0
Unit division: 0

## 2022-09-08 LAB — CBC WITH DIFFERENTIAL/PLATELET
Abs Immature Granulocytes: 0.17 10*3/uL — ABNORMAL HIGH (ref 0.00–0.07)
Basophils Absolute: 0.1 10*3/uL (ref 0.0–0.1)
Basophils Relative: 1 %
Eosinophils Absolute: 0 10*3/uL (ref 0.0–0.5)
Eosinophils Relative: 0 %
HCT: 27.6 % — ABNORMAL LOW (ref 39.0–52.0)
Hemoglobin: 8.3 g/dL — ABNORMAL LOW (ref 13.0–17.0)
Immature Granulocytes: 4 %
Lymphocytes Relative: 17 %
Lymphs Abs: 0.8 10*3/uL (ref 0.7–4.0)
MCH: 28 pg (ref 26.0–34.0)
MCHC: 30.1 g/dL (ref 30.0–36.0)
MCV: 93.2 fL (ref 80.0–100.0)
Monocytes Absolute: 0.2 10*3/uL (ref 0.1–1.0)
Monocytes Relative: 4 %
Neutro Abs: 3.4 10*3/uL (ref 1.7–7.7)
Neutrophils Relative %: 74 %
Platelets: 75 10*3/uL — ABNORMAL LOW (ref 150–400)
RBC: 2.96 MIL/uL — ABNORMAL LOW (ref 4.22–5.81)
RDW: 19.9 % — ABNORMAL HIGH (ref 11.5–15.5)
WBC: 4.6 10*3/uL (ref 4.0–10.5)
nRBC: 5.8 % — ABNORMAL HIGH (ref 0.0–0.2)

## 2022-09-08 LAB — BPAM RBC
Blood Product Expiration Date: 202310272359
Blood Product Expiration Date: 202310292359
ISSUE DATE / TIME: 202310190438
ISSUE DATE / TIME: 202310190819
Unit Type and Rh: 7300
Unit Type and Rh: 7300

## 2022-09-08 LAB — MAGNESIUM: Magnesium: 2.5 mg/dL — ABNORMAL HIGH (ref 1.7–2.4)

## 2022-09-08 LAB — PHOSPHORUS: Phosphorus: 3.6 mg/dL (ref 2.5–4.6)

## 2022-09-08 LAB — COMPREHENSIVE METABOLIC PANEL
ALT: 15 U/L (ref 0–44)
AST: 91 U/L — ABNORMAL HIGH (ref 15–41)
Albumin: 1.9 g/dL — ABNORMAL LOW (ref 3.5–5.0)
Alkaline Phosphatase: 1537 U/L — ABNORMAL HIGH (ref 38–126)
Anion gap: 17 — ABNORMAL HIGH (ref 5–15)
BUN: 106 mg/dL — ABNORMAL HIGH (ref 8–23)
CO2: 14 mmol/L — ABNORMAL LOW (ref 22–32)
Calcium: 7.3 mg/dL — ABNORMAL LOW (ref 8.9–10.3)
Chloride: 117 mmol/L — ABNORMAL HIGH (ref 98–111)
Creatinine, Ser: 2.59 mg/dL — ABNORMAL HIGH (ref 0.61–1.24)
GFR, Estimated: 27 mL/min — ABNORMAL LOW (ref >60)
Glucose, Bld: 306 mg/dL — ABNORMAL HIGH (ref 70–99)
Potassium: 3.6 mmol/L (ref 3.5–5.1)
Sodium: 148 mmol/L — ABNORMAL HIGH (ref 135–145)
Total Bilirubin: 1.3 mg/dL — ABNORMAL HIGH (ref 0.3–1.2)
Total Protein: 5.7 g/dL — ABNORMAL LOW (ref 6.5–8.1)

## 2022-09-08 LAB — GLUCOSE, CAPILLARY
Glucose-Capillary: 287 mg/dL — ABNORMAL HIGH (ref 70–99)
Glucose-Capillary: 236 mg/dL — ABNORMAL HIGH (ref 70–99)
Glucose-Capillary: 149 mg/dL — ABNORMAL HIGH (ref 70–99)
Glucose-Capillary: 166 mg/dL — ABNORMAL HIGH (ref 70–99)

## 2022-09-08 LAB — HIV ANTIBODY (ROUTINE TESTING W REFLEX): HIV Screen 4th Generation wRfx: NONREACTIVE

## 2022-09-08 LAB — LIPASE, BLOOD: Lipase: 324 U/L — ABNORMAL HIGH (ref 11–51)

## 2022-09-08 MED ORDER — HYDROMORPHONE HCL 1 MG/ML IJ SOLN: 0.5000 mg | INTRAMUSCULAR | Status: DC | PRN

## 2022-09-08 NOTE — Consult Note (Signed)
Consultation Note Date: 09/08/2022   Patient Name: Michael Stephenson  DOB: 09/07/1956  MRN: [id number]  Age / Sex: 66 y.o., male  PCP: Pcp, No Referring Physician: Barb Merino, MD  Reason for Consultation: Establishing goals of care  HPI/Patient Profile: 66 y.o. male admitted on 09/02/2022    Clinical Assessment and Goals of Care: 66 year old gentleman with a life limiting illness of metastatic prostate cancer, castrate resistant, has had extensive treatment options, failure to thrive acute renal failure ongoing frailty and generalized deconditioning.  Also with concerns for possible GI bleed. Palliative medicine is specialized medical care for people living with serious illness. It focuses on providing relief from the symptoms and stress of a serious illness. The goal is to improve quality of life for both the patient and the family. Goals of care: Broad aims of medical therapy in relation to the patient's values and preferences. Our aim is to provide medical care aimed at enabling patients to achieve the goals that matter most to them, given the circumstances of their particular medical situation and their constraints.  Discussed with hospital medicine attending, patient seen and examined, agree with current plan, briefly discussed with sister and niece who are present at bedside, appreciate TOC assistance in looking into [place] space options   NEXT OF KIN Wife and son  SUMMARY [place] with DNR/DNI and comfort measures Agree with residential hospice [facility] consulted to facilitate residential hospice, call placed to wife/son await callback, goals of care discussions have been undertaken by [facility] MD, debriefed with Dr. Sloan Leiter about his conversations with the patient and his family, agree with the above. Thank you for the consult  Code Status/Advance Care Planning: DNR   Symptom Management:      Palliative Prophylaxis:  Delirium Protocol  Additional Recommendations (Limitations, Scope, Preferences): Full Comfort Care  Psycho-social/Spiritual:  Desire for further Chaplaincy support:yes Additional Recommendations: Caregiving  Support/Resources  Prognosis:  < 2 weeks  Discharge Planning: Hospice facility      Primary Diagnoses: Present on Admission:  Type 2 diabetes mellitus with complication, without long-term current use of insulin ([place])  Hypertension  Prostate cancer metastatic to bone (HCC)  Obesity  Symptomatic anemia  Pressure injury of skin   I have reviewed the medical record, interviewed the patient and family, and examined the patient. The following aspects are pertinent.  Past Medical History:  Diagnosis Date   Anxiety    DM (diabetes mellitus) ([place])    Elevated PSA 09/24/2019   Heart murmur    HLD (hyperlipidemia)    Hypertension    Social History   Socioeconomic History   Marital status: Married    Spouse name: Not on file   Number of children: 4   Years of education: Not on file   Highest education level: Not on file  Occupational History   Not on file  Tobacco Use   Smoking status: Former    Types: Cigarettes    Quit date: 1985    Years since quitting: 71.8  Smokeless tobacco: Never  Substance and Sexual Activity   Alcohol use: No   Drug use: Yes    Types: Marijuana   Sexual activity: Not on file  Other Topics Concern   Not on file  Social History Narrative   Not on file   Social Determinants of Health   Financial Resource Strain: Not on file  Food Insecurity: No Food Insecurity (09/07/2022)   Hunger Vital Sign    Worried About Running Out of Food in the Last Year: Never true    Ran Out of Food in the Last Year: Never true  Transportation Needs: No Transportation Needs (09/07/2022)   PRAPARE - Hydrologist (Medical): No    Lack of Transportation (Non-Medical): No  Physical Activity: Not  on file  Stress: Not on file  Social Connections: Not on file   Family History  Problem Relation Age of Onset   Benign prostatic hyperplasia Father    Heart disease Mother    Kidney disease Mother    Diabetes Sister    Stroke Brother    Colon cancer Neg Hx    Esophageal cancer Neg Hx    Stomach cancer Neg Hx    Rectal cancer Neg Hx    Scheduled Meds:  insulin aspart  0-9 Units Subcutaneous TID WC   sodium chloride flush  3 mL Intravenous Q12H   Continuous Infusions:  sodium chloride 100 mL/hr at 09/08/22 1110   sodium chloride Stopped (09/19/2022 2120)   sodium chloride     PRN Meds:.acetaminophen **OR** acetaminophen, HYDROmorphone (DILAUDID) injection, oxyCODONE, polyethylene glycol Medications Prior to Admission:  Prior to Admission medications   Medication Sig Start Date End Date Taking? Authorizing Provider  amLODipine (NORVASC) 5 MG tablet TAKE 1 TABLET BY MOUTH EVERYDAY AT BEDTIME 11/16/20  Yes Shadad, Mathis Dad, MD  empagliflozin (JARDIANCE) 25 MG TABS tablet Take 25 mg by mouth daily.   Yes [provider]  metFORMIN (GLUCOPHAGE) 1000 MG tablet Take 1 tablet (1,000 mg total) by mouth 2 (two) times daily with a meal. 03/21/19  Yes Nuala Alpha, MD  morphine (MS CONTIN) 15 MG 12 hr tablet Take 15 mg by mouth every 12 (twelve) hours.   Yes [provider]  ondansetron (ZOFRAN) 8 MG tablet Take 8 mg by mouth every 8 (eight) hours as needed for nausea or vomiting.   Yes [provider]  oxyCODONE (OXY IR/ROXICODONE) 5 MG immediate release tablet Take 1 tablet (5 mg total) by mouth every 4 (four) hours as needed for severe pain. 11/11/19  Yes Wyatt Portela, MD  predniSONE (DELTASONE) 5 MG tablet Take 5 mg by mouth daily with breakfast.   Yes [provider]  rosuvastatin (CRESTOR) 40 MG tablet Take 40 mg by mouth daily.   Yes [provider]  sildenafil (VIAGRA) 100 MG tablet Take 0.5-1 tablets (50-100 mg total) by mouth daily as  needed for erectile dysfunction. 04/28/16  Yes McKeag, Marylynn Pearson, MD  spironolactone (ALDACTONE) 25 MG tablet Take 25 mg by mouth daily.   Yes [provider]  abiraterone acetate (ZYTIGA) 250 MG tablet Take 4 tablets (1,000 mg total) by mouth daily. Take on an empty stomach 1 hour before or 2 hours after a meal Patient not taking: Reported on 09/07/2022 09/17/20   Wyatt Portela, MD  blood glucose meter kit and supplies Dispense based on patient and insurance preference. Use up to four times daily as directed. (FOR ICD-9 250.00, 250.01). 04/05/15  Regalado, Belkys A, MD  calcium-vitamin D (OSCAL WITH D) 500-200 MG-UNIT tablet Take 1 tablet by mouth 2 (two) times daily. Patient not taking: Reported on 09/07/2022 11/11/19   Wyatt Portela, MD  gabapentin (NEURONTIN) 100 MG capsule Take 1 capsule (100 mg total) by mouth 3 (three) times daily. Patient not taking: Reported on 09/07/2022 09/05/19   Autry-Lott, Naaman Plummer, DO  metFORMIN (GLUCOPHAGE-XR) 500 MG 24 hr tablet Take 2 tablets (1,000 mg total) by mouth daily with breakfast. Patient not taking: Reported on 09/07/2022 10/09/19   Kathrene Alu, MD  tadalafil (ADCIRCA/CIALIS) 20 MG tablet Take 0.5-1 tablets (10-20 mg total) by mouth every other day as needed for erectile dysfunction. Patient not taking: Reported on 09/07/2022 03/21/19   Nuala Alpha, MD  atorvastatin (LIPITOR) 40 MG tablet Take 1 tablet (40 mg total) by mouth daily. 01/02/19 07/09/19  Harriet Butte, DO   No Known Allergies Review of Systems +weakness  Physical Exam Frail, chronically ill-appearing Appears pale Shallow regular breath sounds S1-S2 Generalized weakness Answers a few questions appropriately otherwise mumbles incoherently at times  Vital Signs: BP 120/70 (BP Location: Right Arm)   Pulse 100   Temp (!) 97.5 F (36.4 C) (Oral)   Resp (!) 22   SpO2 97%  Pain Scale: 0-10   Pain Score: 0-No pain   SpO2: SpO2: 97 % O2 Device:SpO2: 97 % O2 Flow  Rate: .   IO: Intake/output summary:  Intake/Output Summary (Last 24 hours) at 09/08/2022 1256 Last data filed at 09/08/2022 1100 Gross per 24 hour  Intake 2446.26 ml  Output 1300 ml  Net 1146.26 ml    LBM: Last BM Date : 09/08/22 Baseline Weight:   Most recent weight:       Palliative Assessment/Data:   PPS 30%  Time In:  1400 Time Out:  1500 Time Total:  60  Greater than 50%  of this time was spent counseling and coordinating care related to the above assessment and plan.  Signed by: Loistine Chance, MD   Please contact Palliative Medicine Team phone at [phone] for questions and concerns.  For individual provider: See Shea Evans

## 2022-09-08 NOTE — Progress Notes (Signed)
  Interdisciplinary Goals of Care Family Meeting   Date carried out: 09/08/2022  Location of the meeting: Bedside and conference room   Member's involved: Physician, Bedside Registered Nurse, Family Member or next of kin, and Other: Wife Lannette Donath and Son Walter Grima  Durable Power of Attorney or acting medical decision maker: Caesar Bookman    Discussion: We discussed goals of care for Michael Stephenson .  As previously stated patient suffers from metastatic prostate cancer that is castrate resistant and exhausted treatment, severe painful conditions, failure to thrive, acute renal failure and severe frailty and physical debility. I explained this situation to the patient and I talked to him whether he has heard about hospice before.  He tells me that he was told by his cancer doctor about this but he has not agreed.  When explained to him about hospice care, he agrees that he would like to die in a comfortable situation. Then again I met with patient's wife and son at the conference room, both of them agree that patient is at end-of-life.  They were agreeable to hospice in the past but patient had not agreed when recommended by his oncologist at [facility]. Patient has not eaten any meaningful meals for at least 3 to 4 weeks.  He has remained so debilitated, they doubt he is in a rehab candidate. His other son suffers from schizophrenia and becomes violent and he cannot go back home.  Patient and family agrees for comfort care and pursue inpatient hospice placement. Patient has [employer] insurance.  They want to explore whether patient can go to inpatient hospice in [place], if unable we need to pursue placement to [place] home or [place] provider hospice care.  Plan for today: Continue maintenance IV fluids. Foley catheter placed for urinary retention, more than 800 mL bloody urine drained. Softer stool noted with streaks of fresh blood on rectal exam with no localized tenderness or bleeding  evidence. Discontinue all lab testing, no benefit with ongoing transfusions. Comfort care pathway.  Provide end-of-life care. Will discuss with palliative care to facilitate transition. Increase Dilaudid frequency to every 2 hours as needed.   Hopefully we can secure an inpatient bed at [facility]. Unrestricted visitation policy. RN to pronounce death if happens in the hospital.   Code status: Limited Code or DNR with short term  Disposition: In-patient comfort care  Time spent for the meeting: 35 minutes.    Barb Merino, MD  09/08/2022, 11:46 AM

## 2022-09-08 NOTE — Progress Notes (Signed)
PROGRESS NOTE    Michael Stephenson  HBZ:[id number] DOB: [birth year] DOA: 09/18/2022 PCP: Pcp, No    Brief Narrative:  66 year old gentleman with history of essential hypertension, type 2 diabetes, metastatic prostate cancer receiving oral chemotherapy, currently declining clinical status brought to the emergency room with decreasing oral intake, weight loss, decreasing blood pressures and bedbound with profound weakness.  Lives at home with wife and son helps.  In the emergency room initial temperature 92.2, heart rate 100, blood pressures normal. Sodium 147, potassium 3.4, creatinine 2.7 with recent creatinine of 1.2.  Glucose 412.  ALP 1683.  Hemoglobin 6.3 with recent hemoglobin of 11.6 about a month ago.  Lipase greater than 1300.  Urine with glucose and hemoglobin.  Chest x-ray normal.  CT scan abdomen pelvis with acute interstitial pancreatitis and questionable gallbladder changes, widespread bony metastatic lesions.  Patient was treated with Bair hugger, 2 units of PRBC transfusion started, received fentanyl insulin and fluid boluses.  Admitted due to significant symptom   Assessment & Plan:   Symptomatic anemia with hemoglobin 6.3, probably due to bone marrow suppression from widespread bony metastasis: Nutritional deficiencies.  Iron studies with normal iron, increased ferritin.  No evidence of iron-deficiency anemia. No evidence of active bleeding. Received 2 units of PRBC transfusion with appropriate response.  Continue to monitor.  Recheck tomorrow morning. Very frail and debilitated.  No active bleeding.     Environmental hypothermia, severe dehydration with hyperchloremic hypernatremia, acute renal failure secondary to above: Aggressively resuscitated.  Renal functions trending down.  Intake output monitoring.  No evidence of urinary obstruction. Continue IV fluids today. Rule out urinary obstruction, bladder scan and possible Foley catheter if needed.  Acute pancreatitis,  suspected alcoholic: CT scan evidence of pancreatitis.  Lipase more than 1300.  Aggressively resuscitated.  Lipase 300 today.  Without abdominal pain or nausea but poor appetite. Continue IV fluids.  We will keep on clears today.  Abdomen is benign.   Failure to thrive, metastatic prostate cancer castrate resistant, severe protein calorie malnutrition and severe dehydration: Patient probably at the end of life. He was previously recommended to hospice by his oncologist at [facility], notes reviewed at care everywhere. Patient is appropriate for end-of-life care and hospice.  Will meet with family. consult palliative care team to facilitate discussion. Resume his home pain regimen including MS Contin.   Type 2 diabetes with hyperglycemia: Keep on low-dose sliding scale insulin with poor appetite.       DVT prophylaxis: SCDs Start: 09/07/22 0053   Code Status: Full code Family Communication: None at the bedside today Disposition Plan: Status is: Inpatient Remains inpatient appropriate because: Severe systemic disease, IV fluids,     Consultants:  Palliative care  Procedures:  None  Antimicrobials:  None   Subjective: Patient seen and examined.  He tells me his appetite is poor otherwise denies any complaints.  Denies any nausea vomiting.  Whole body hurts.  Objective: Vitals:   09/07/22 2029 09/07/22 2355 09/08/22 0547 09/08/22 0547  BP: 115/67 120/63 130/66 130/66  Pulse: 89 95 96 97  Resp: '20 17 19 19  '$ Temp: 97.6 F (36.4 C) 98 F (36.7 C) 98.1 F (36.7 C) 98.1 F (36.7 C)  TempSrc: Oral Oral Oral Oral  SpO2: 100% 100% 100% 100%    Intake/Output Summary (Last 24 hours) at 09/08/2022 1040 Last data filed at 09/08/2022 0921 Gross per 24 hour  Intake 2922.26 ml  Output 100 ml  Net 2822.26 ml   There were  no vitals filed for this visit.  Examination:  General exam: Frail and debilitated.  Pale looking.  Chronically sick. Respiratory system: No added  sounds. Cardiovascular system: S1 & S2 heard, RRR. No pedal edema. Gastrointestinal system: Abdomen is nondistended, soft and nontender. No organomegaly or masses felt. Normal bowel sounds heard. Central nervous system: Alert and oriented.  Generalized weakness.  Flat affect.    Data Reviewed: I have personally reviewed following labs and imaging studies  CBC: Recent Labs  Lab 09/02/2022 1951 09/07/22 0552 09/07/22 1605 09/08/22 0507  WBC 4.1 5.2  --  4.6  NEUTROABS 3.0  --   --  3.4  HGB 6.3* 6.7* 9.4* 8.3*  HCT 22.4* 23.3* 29.7* 27.6*  MCV 93.7 92.1  --  93.2  PLT 106* 100*  --  75*   Basic Metabolic Panel: Recent Labs  Lab 09/18/2022 1951 09/07/22 0552 09/08/22 0507  NA 147* 152* 148*  K 3.4* 3.6 3.6  CL 112* 115* 117*  CO2 21* 22 14*  GLUCOSE 412* 238* 306*  BUN 113* 113* 106*  CREATININE 2.71* 3.13* 2.59*  CALCIUM 8.0* 8.4* 7.3*  MG 2.5*  --  2.5*  PHOS  --   --  3.6   GFR: CrCl cannot be calculated (Unknown ideal weight.). Liver Function Tests: Recent Labs  Lab 08/29/2022 1951 09/07/22 0552 09/08/22 0507  AST 78* 78* 91*  ALT '14 14 15  '$ ALKPHOS 1,683* 1,717* 1,537*  BILITOT 1.2 1.1 1.3*  PROT 6.3* 6.2* 5.7*  ALBUMIN 2.1* 2.0* 1.9*   Recent Labs  Lab 08/25/2022 1951 09/08/22 0507  LIPASE 1,345* 324*   No results for input(s): "AMMONIA" in the last 168 hours. Coagulation Profile: No results for input(s): "INR", "PROTIME" in the last 168 hours. Cardiac Enzymes: No results for input(s): "CKTOTAL", "CKMB", "CKMBINDEX", "TROPONINI" in the last 168 hours. BNP (last 3 results) No results for input(s): "PROBNP" in the last 8760 hours. HbA1C: Recent Labs    09/07/22 0552  HGBA1C 6.4*   CBG: Recent Labs  Lab 09/07/22 0145 09/07/22 0816 09/07/22 1259 09/07/22 2215 09/08/22 0821  GLUCAP 203* 247* 228* 223* 287*   Lipid Profile: No results for input(s): "CHOL", "HDL", "LDLCALC", "TRIG", "CHOLHDL", "LDLDIRECT" in the last 72 hours. Thyroid Function  Tests: No results for input(s): "TSH", "T4TOTAL", "FREET4", "T3FREE", "THYROIDAB" in the last 72 hours. Anemia Panel: Recent Labs    09/07/22 0242 09/07/22 0552  FERRITIN >7,500*  --   TIBC 213*  --   IRON 138  --   RETICCTPCT  --  3.0   Sepsis Labs: Recent Labs  Lab 09/16/2022 1951 09/19/2022 2139  LATICACIDVEN 2.5* 2.5*    Recent Results (from the past 240 hour(s))  Blood culture (routine x 2)     Status: None (Preliminary result)   Collection Time: 09/13/2022  7:51 PM   Specimen: BLOOD  Result Value Ref Range Status   Specimen Description   Final    BLOOD LEFT ANTECUBITAL Performed at [facility], [place] [address] [place], [place] [address]    Special Requests   Final    BOTTLES DRAWN AEROBIC AND ANAEROBIC Blood Culture adequate volume Performed at [facility] [address], [place], [place] [address]    Culture   Final    NO GROWTH 2 DAYS Performed at [facility] [address], [place], [place] [address]    Report Status PENDING  Incomplete  Blood culture (routine x 2)  Status: None (Preliminary result)   Collection Time: 09/10/2022  9:39 PM   Specimen: BLOOD  Result Value Ref Range Status   Specimen Description   Final    BLOOD RIGHT ANTECUBITAL Performed at [facility] [address] [place], [place] [address]    Special Requests   Final    BOTTLES DRAWN AEROBIC AND ANAEROBIC Blood Culture results may not be optimal due to an inadequate volume of blood received in culture bottles Performed at [facility] [address], [place], [place] [address]    Culture   Final    NO GROWTH 2 DAYS Performed at [facility] [address], [place], [place] [address]    Report Status PENDING  Incomplete         Radiology Studies: CT ABDOMEN PELVIS WO CONTRAST  Result Date: 09/07/2022 CLINICAL DATA:  Generalized abdominal pain; hematuria; elevated lipase EXAM: CT  ABDOMEN AND PELVIS WITHOUT CONTRAST TECHNIQUE: Multidetector CT imaging of the abdomen and pelvis was performed following the standard protocol without IV contrast. RADIATION DOSE REDUCTION: This exam was performed according to the departmental dose-optimization program which includes automated exposure control, adjustment of the mA and/or kV according to patient size and/or use of iterative reconstruction technique. COMPARISON:  PET/CT 02/22/2022 and CT abdomen and pelvis 11/10/2019 FINDINGS: Lower chest: No acute abnormality. Hepatobiliary: The gallbladder is distended with sludge. No definite biliary dilation. Question mild gallbladder wall thickening or pericholecystic fluid though evaluation is limited by respiratory motion. No suspicious liver lesion. Pancreas: Pancreas appears edematous with mild adjacent peripancreatic fluid and stranding. Spleen: The spleen is unremarkable. Small amount of perisplenic free fluid. Adrenals/Urinary Tract: Adrenal glands are unremarkable. Kidneys are normal, without renal calculi, focal lesion, or hydronephrosis. Bladder is unremarkable. Stomach/Bowel: Normal caliber large and small bowel. Unremarkable stomach. Mild inflammatory wall thickening about the duodenum likely reactive secondary to pancreatitis. Appendix is not visualized. Vascular/Lymphatic: Aortic atherosclerosis. No enlarged abdominal or pelvic lymph nodes. Reproductive: Unremarkable. Other: No free intraperitoneal air. Musculoskeletal: No acute abnormality. Diffuse sclerotic osseous metastases throughout the visualized axial and appendicular skeleton. IMPRESSION: Acute interstitial pancreatitis. Question gallbladder wall thickening/pericholecystic fluid. Sludge within the gallbladder. If there is concern for cholecystitis consider ultrasound for further evaluation. Widespread osseous metastases. Aortic Atherosclerosis (ICD10-I70.0). Electronically Signed   By: Placido Sou M.D.   On: 09/01/2022 23:56   DG  Chest Portable 1 View  Result Date: 09/08/2022 CLINICAL DATA:  Hypothermia EXAM: PORTABLE CHEST 1 VIEW COMPARISON:  07/03/2022 FINDINGS: Heart and mediastinal contours are within normal limits. No focal opacities or effusions. No acute bony abnormality. IMPRESSION: No active disease. Electronically Signed   By: Rolm Baptise M.D.   On: 08/29/2022 20:22        Scheduled Meds:  insulin aspart  0-9 Units Subcutaneous TID WC   sodium chloride flush  3 mL Intravenous Q12H   Continuous Infusions:  sodium chloride 100 mL/hr at 09/08/22 0921   sodium chloride Stopped (09/01/2022 2120)   sodium chloride       LOS: 1 day    Time spent: 40 minutes    Barb Merino, MD [facility] Pager [phone]

## 2022-09-08 NOTE — TOC Initial Note (Addendum)
Transition of Care [facility]) - Initial/Assessment Note    Patient Details  Name: Michael Stephenson MRN: [id number] Date of Birth: 10-Feb-1956  [facility]) CM/SW Contact:    Dessa Phi, RN Phone Number: 09/08/2022, 12:30 PM  Clinical Narrative: Referral for Residential hospice-left vm w/spouse Michael Stephenson Kayshawn (son) await call back.   [facility] Baas [phone] [id number]. Mar [place], [place] office;Shcaleah Kelton(CSW) [phone] [id number] coordinator Verdel Gaudreau(NP) [phone];Laura will email the hospice form.                 Expected Discharge Plan: [place] Barriers to Discharge: Continued Medical Work up   Patient Goals and CMS Choice Patient states their goals for this hospitalization and ongoing recovery are::  (Residential hospice) CMS Medicare.gov Compare Post Acute Care list provided to:: Patient Represenative (must comment) (Priscilla(spouse)) Choice offered to / list presented to : Spouse  Expected Discharge Plan and Services Expected Discharge Plan: [place]   Discharge Planning Services: CM Consult   Living arrangements for the past 2 months: Single Family Home                                      Prior Living Arrangements/Services Living arrangements for the past 2 months: Single Family Home Lives with:: Spouse Patient language and need for interpreter reviewed:: Yes Do you feel safe going back to the place where you live?: Yes      Need for Family Participation in Patient Care: Yes (Comment) Care giver support system in place?: Yes (comment)   Criminal Activity/Legal Involvement Pertinent to Current Situation/Hospitalization: No - Comment as needed  Activities of Daily Living Home Assistive Devices/Equipment: None ADL Screening (condition at time of admission) Patient's cognitive ability adequate to safely complete daily activities?: Yes Is the patient deaf or  have difficulty hearing?: No Does the patient have difficulty seeing, even when wearing glasses/contacts?: No Does the patient have difficulty concentrating, remembering, or making decisions?: No Patient able to express need for assistance with ADLs?: Yes Does the patient have difficulty dressing or bathing?: Yes Independently performs ADLs?: No Communication: Independent Dressing (OT): Needs assistance Is this a change from baseline?: Pre-admission baseline Grooming: Needs assistance Is this a change from baseline?: Pre-admission baseline Feeding: Needs assistance Is this a change from baseline?: Pre-admission baseline Bathing: Needs assistance Is this a change from baseline?: Pre-admission baseline Toileting: Needs assistance Is this a change from baseline?: Pre-admission baseline In/Out Bed: Needs assistance Is this a change from baseline?: Pre-admission baseline Walks in Home: Dependent Is this a change from baseline?: Pre-admission baseline Does the patient have difficulty walking or climbing stairs?: Yes Weakness of Legs: Both Weakness of Arms/Hands: Both  Permission Sought/Granted Permission sought to share information with : Case Manager Permission granted to share information with : Yes, Verbal Permission Granted  Share Information with NAME:  (Case manager)           Emotional Assessment Appearance:: Appears stated age Attitude/Demeanor/Rapport: Gracious Affect (typically observed): Accepting Orientation: : Oriented to Self, Oriented to Place, Oriented to  Time, Oriented to Situation Alcohol / Substance Use: Not Applicable Psych Involvement: No (comment)  Admission diagnosis:  Hyperglycemia [R73.9] AKI (acute kidney injury) ([name]) [N17.9] Elevated lipase [R74.8] Symptomatic anemia [D64.9] Anemia, unspecified type [D64.9] Acute pancreatitis, unspecified complication status, unspecified pancreatitis type [K85.90] Patient Active Problem List   Diagnosis Date  Noted  Pressure injury of skin 09/08/2022   Prostate cancer metastatic to bone ([place]) 09/07/2022   Obesity 09/07/2022   Symptomatic anemia 09/07/2022   Prostate cancer ([place]) 10/29/2019   Diplopia 10/10/2019   Loss of weight 09/24/2019   Screening for viral disease 09/24/2019   Elevated alkaline phosphatase level 09/16/2019   Black stool 09/05/2019   Hip pain 09/05/2019   Libido, decreased 03/27/2019   Numbness and tingling of left lower extremity 03/12/2019   Palpitations 01/02/2019   Encounter for annual physical exam 01/02/2019   Type 2 diabetes mellitus with complication, without long-term current use of insulin ([place]) 04/28/2016   Erectile dysfunction 04/28/2016   Other specified transient cerebral ischemias    Paresthesias    History of TIA (transient ischemic attack) 04/03/2015   Postop check 04/16/2012   Hypertension 04/16/2012   PCP:  Pcp, No Pharmacy:   [id number] [id number]: [phone]: [phone] [place] [id number]: [phone]: [phone]    Social Determinants of Health (SDOH) Interventions    Readmission Risk Interventions     No data to display

## 2022-09-11 LAB — CULTURE, BLOOD (ROUTINE X 2)
Culture: NO GROWTH
Culture: NO GROWTH
Special Requests: ADEQUATE

## 2022-09-20 NOTE — Progress Notes (Signed)
      OVERNIGHT PROGRESS REPORT  Notified by RN that patient has expired at 0410  Patient was comfort care. 2 RN verified.  Family was not present. RN attempted to call family, but no one has answered.   Raenette Rover, DNP, [place]

## 2022-09-20 NOTE — Progress Notes (Signed)
23 Family stated finished with visitation.   RN Removed IV caths x 2, foley cath, tagged toe, & zipper. Pt taken to [facility] 1500. Caren Griffins, RN, & security placed pt on cart. All information received & complete from family

## 2022-09-20 NOTE — Final Progress Note (Addendum)
Patient has expired at 0410.   Expiration verified by 2 RNs.   Family not present. Attempted to call family, Son answered- "Stated on his way"  NP A. Zebedee Iba contacted and aware of patient expiration.   Patient cleaned and presentable.

## 2022-09-20 NOTE — Death Summary Note (Signed)
DEATH SUMMARY   Patient Details  Name: Michael Stephenson MRN: [id number] DOB: 04-Nov-1956 PCP:Pcp, No Admission/Discharge Information   Admit Date:  September 13, 2022  Date of Death: Date of Death: 09-16-2022  Time of Death: Time of Death: 0410  Length of Stay: 2   Principle Cause of death: Encephalopathy and failure to thrive secondary to metastatic prostate cancer  Hospital Diagnoses: Principal Problem:   Symptomatic anemia Active Problems:   Hypertension   History of TIA (transient ischemic attack)   Type 2 diabetes mellitus with complication, without long-term current use of insulin (HCC)   Prostate cancer metastatic to bone ([place])   Obesity   Pressure injury of skin   Hospital Course: Patient was 66 year old gentleman with history of hypertension, type 2 diabetes, metastatic prostate cancer with failure to thrive and declining clinical status who was brought to the emergency room with decreased oral intake, weight loss, low blood pressures and profound weakness.  He was found in the ER with acute renal failure, acute anemia with hemoglobin 6.3.  CT scan abdomen pelvis with acute interstitial pancreatitis, widely spread bony metastatic lesions.  Due to severity of illness, a palliative care discussion was done and patient and family agreed to comfort care and hospice.  He was converted to comfort care, transfer to [place] facilities to provide hospice services were initiated.  Overnight, patient died in the hospital under comfort care.         Procedures: None  Consultations: Palliative  The results of significant diagnostics from this hospitalization (including imaging, microbiology, ancillary and laboratory) are listed below for reference.   Significant Diagnostic Studies: CT ABDOMEN PELVIS WO CONTRAST  Result Date: September 13, 2022 CLINICAL DATA:  Generalized abdominal pain; hematuria; elevated lipase EXAM: CT ABDOMEN AND PELVIS WITHOUT CONTRAST TECHNIQUE: Multidetector CT imaging of  the abdomen and pelvis was performed following the standard protocol without IV contrast. RADIATION DOSE REDUCTION: This exam was performed according to the departmental dose-optimization program which includes automated exposure control, adjustment of the mA and/or kV according to patient size and/or use of iterative reconstruction technique. COMPARISON:  PET/CT 02/22/2022 and CT abdomen and pelvis 11/10/2019 FINDINGS: Lower chest: No acute abnormality. Hepatobiliary: The gallbladder is distended with sludge. No definite biliary dilation. Question mild gallbladder wall thickening or pericholecystic fluid though evaluation is limited by respiratory motion. No suspicious liver lesion. Pancreas: Pancreas appears edematous with mild adjacent peripancreatic fluid and stranding. Spleen: The spleen is unremarkable. Small amount of perisplenic free fluid. Adrenals/Urinary Tract: Adrenal glands are unremarkable. Kidneys are normal, without renal calculi, focal lesion, or hydronephrosis. Bladder is unremarkable. Stomach/Bowel: Normal caliber large and small bowel. Unremarkable stomach. Mild inflammatory wall thickening about the duodenum likely reactive secondary to pancreatitis. Appendix is not visualized. Vascular/Lymphatic: Aortic atherosclerosis. No enlarged abdominal or pelvic lymph nodes. Reproductive: Unremarkable. Other: No free intraperitoneal air. Musculoskeletal: No acute abnormality. Diffuse sclerotic osseous metastases throughout the visualized axial and appendicular skeleton. IMPRESSION: Acute interstitial pancreatitis. Question gallbladder wall thickening/pericholecystic fluid. Sludge within the gallbladder. If there is concern for cholecystitis consider ultrasound for further evaluation. Widespread osseous metastases. Aortic Atherosclerosis (ICD10-I70.0). Electronically Signed   By: Placido Sou M.D.   On: 09-13-2022 23:56   DG Chest Portable 1 View  Result Date: Sep 13, 2022 CLINICAL DATA:  Hypothermia  EXAM: PORTABLE CHEST 1 VIEW COMPARISON:  07/03/2022 FINDINGS: Heart and mediastinal contours are within normal limits. No focal opacities or effusions. No acute bony abnormality. IMPRESSION: No active disease. Electronically Signed   By: Rolm Baptise M.D.  On: 08/25/2022 20:22    Microbiology: Recent Results (from the past 240 hour(s))  Blood culture (routine x 2)     Status: None   Collection Time: 09/18/2022  7:51 PM   Specimen: BLOOD  Result Value Ref Range Status   Specimen Description   Final    BLOOD LEFT ANTECUBITAL Performed at [facility] [address], [place], [place] [address]    Special Requests   Final    BOTTLES DRAWN AEROBIC AND ANAEROBIC Blood Culture adequate volume Performed at [facility] [address] [place], [place] [address]    Culture   Final    NO GROWTH 5 DAYS Performed at [facility], [place] [address], [place], [place] [address]    Report Status 09/11/2022 FINAL  Final  Blood culture (routine x 2)     Status: None   Collection Time: 09/18/2022  9:39 PM   Specimen: BLOOD  Result Value Ref Range Status   Specimen Description   Final    BLOOD RIGHT ANTECUBITAL Performed at [facility] [address] [place], [place] [address]    Special Requests   Final    BOTTLES DRAWN AEROBIC AND ANAEROBIC Blood Culture results may not be optimal due to an inadequate volume of blood received in culture bottles Performed at [facility] [address] [place], [place] [address]    Culture   Final    NO GROWTH 5 DAYS Performed at [facility], [place] [address], [place], [place] [address]    Report Status 09/11/2022 FINAL  Final     Signed: Barb Merino, MD 10-05-22

## 2022-09-20 DEATH — deceased

## 2023-10-31 IMAGING — PT NM PET TUM IMG SKULL BASE T - THIGH
1 series · 9 of 9 positions shown · non-contrast
Comparison: Comparison made with November 06, 2019.

CLINICAL DATA: Elevated PSA to 11.0, history of prostate cancer,
initial diagnosis with PSA of 771 found to have bone involvement at
the time of diagnosis previously having received androgen
deprivation.

EXAM:
NUCLEAR MEDICINE PET SKULL BASE TO THIGH
TECHNIQUE: 9.5 mCi F18 Piflufolastat (Pylarify) was injected intravenously.
Full-ring PET imaging was performed from the skull base to thigh
after the radiotracer. CT data was obtained and used for attenuation
correction and anatomic localization.

[Series 1070: results mm oncology reading · 1.0mm · 0.89mm/px · 9 of 9 slices shown]
[im 1/9]
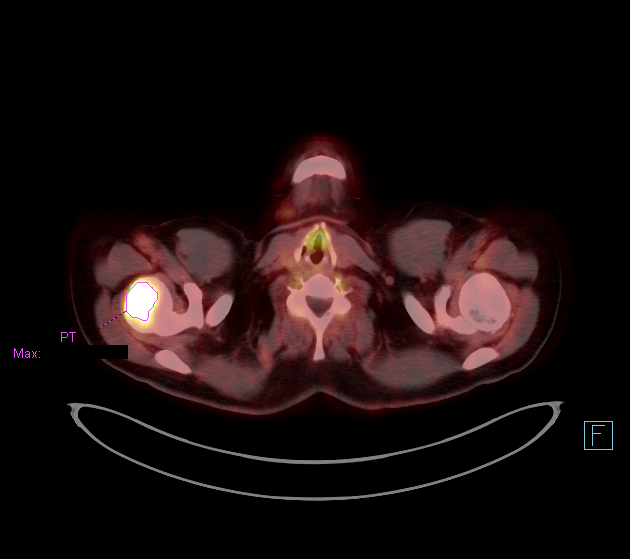
[im 2/9]
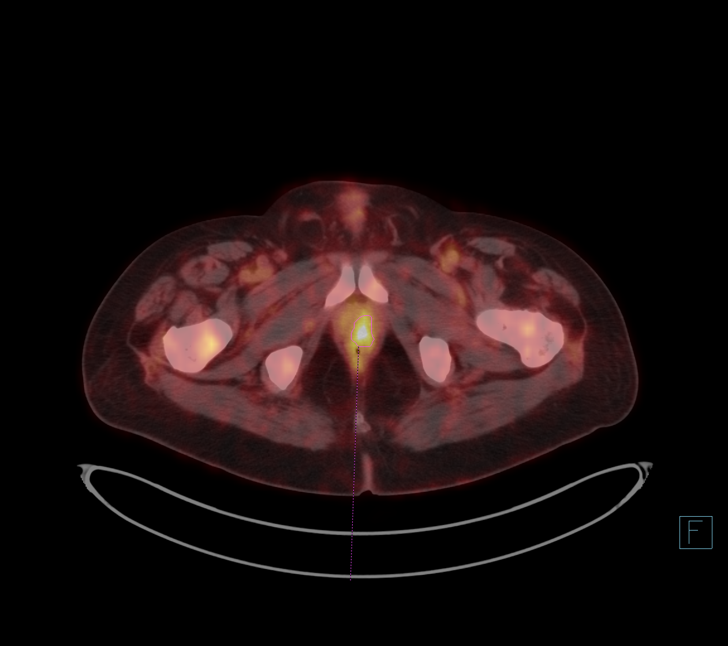
[im 3/9]
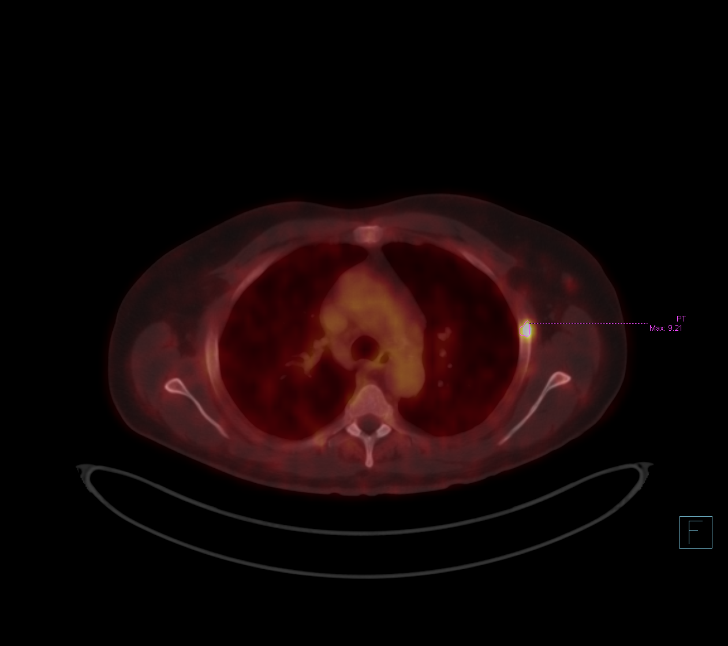
[im 4/9]
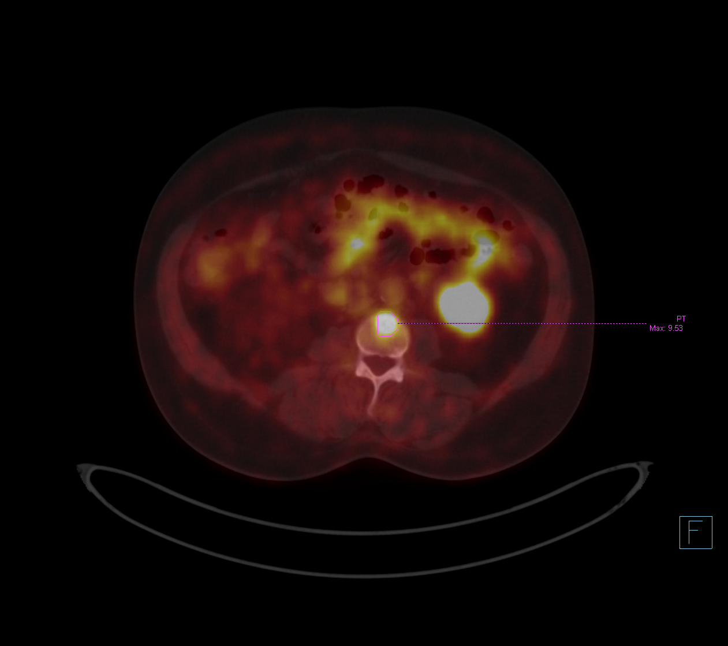
[im 5/9]
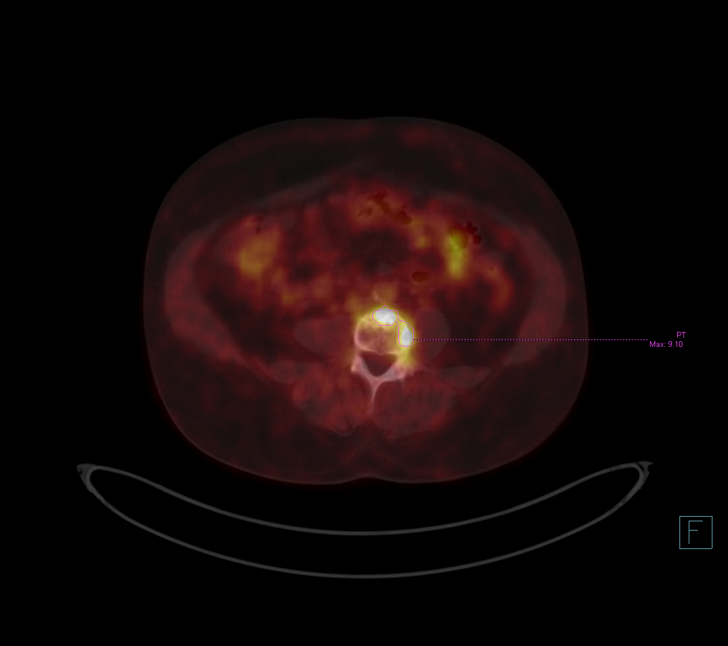
[im 6/9]
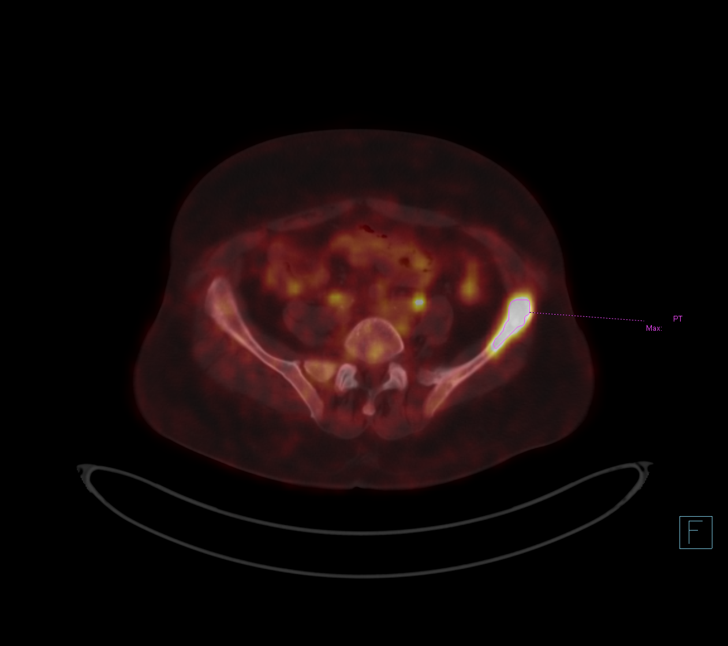
[im 7/9]
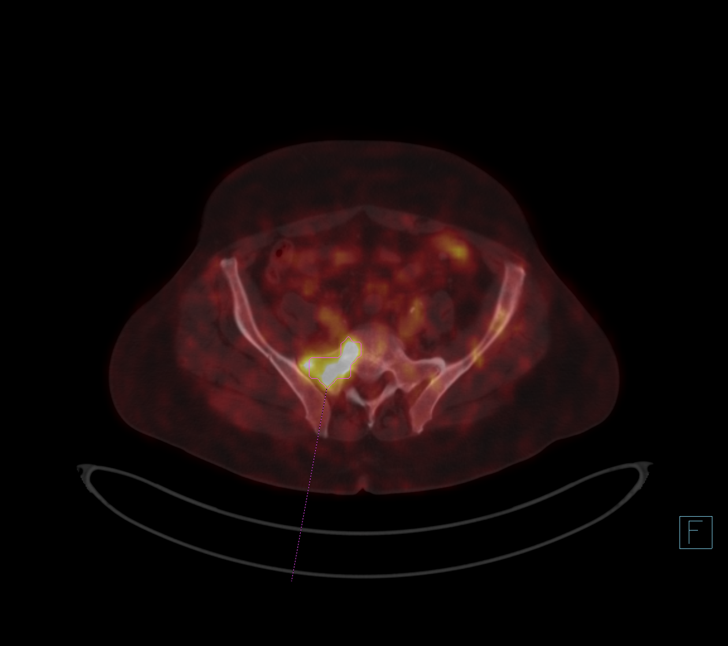
[im 8/9]
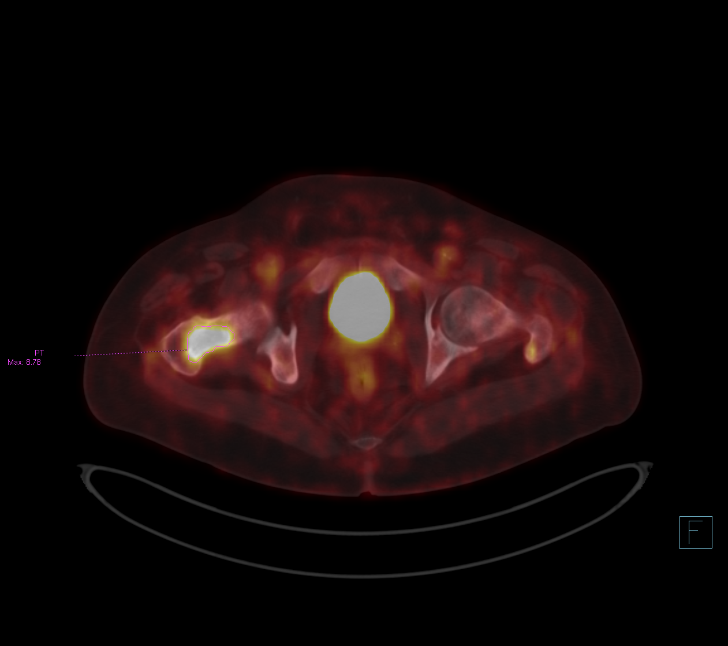
[im 9/9]
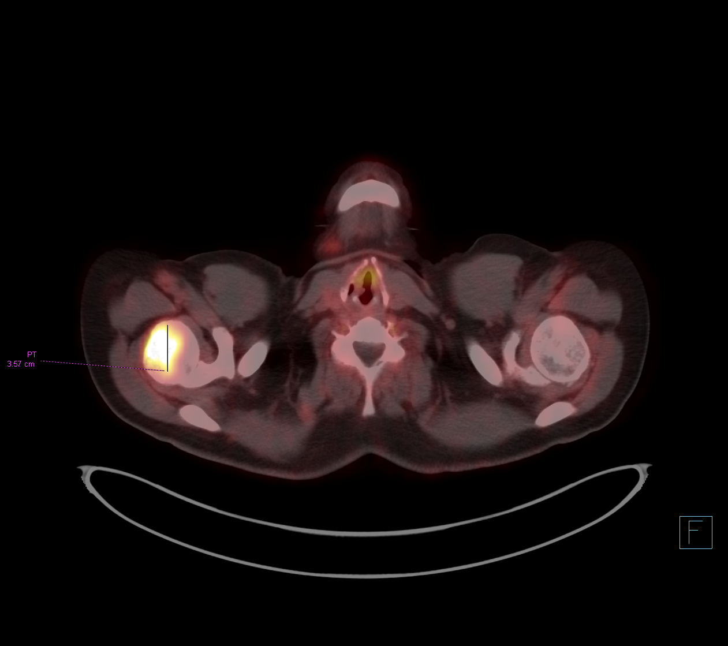

[9 of 9 positions shown; findings below may reference images not displayed]

FINDINGS: NECK

No radiotracer activity in neck lymph nodes.

Incidental CT finding: None

CHEST

No radiotracer accumulation within mediastinal or hilar lymph nodes.
No suspicious pulmonary nodules on the CT scan.

Incidental CT finding: Aortic atherosclerosis. No aneurysmal
dilation. Normal heart size. No pericardial effusion. No adenopathy
by size criteria in the chest. No consolidation. No pleural
effusion. No suspicious pulmonary nodule. Airways are patent.

ABDOMEN/PELVIS

Prostate: Focal activity in the prostate with a maximum SUV of
(image 216/4)

Lymph nodes: No abnormal radiotracer accumulation within pelvic or
abdominal nodes.

Liver: No evidence of liver metastasis

Incidental CT finding: Hepatic steatosis, moderate. No
pericholecystic stranding. No pancreatic inflammation. No acute
findings related to the spleen, adrenal glands, kidneys, urinary
bladder, stomach, small and large bowel. Signs of appendectomy and
colonic diverticulosis. Aortic atherosclerosis without aneurysm. No
adenopathy by size criteria in the abdomen or in the pelvis. Small
fat containing bilateral inguinal hernias. Small fat containing
umbilical hernia. No ascites.

SKELETON

Multifocal skeletal metastases.

RIGHT humeral head (image 62/4) heterogeneous appearance and mottled
appearance with faint areas of sclerosis, no well-defined sclerotic
lesion with a maximum SUV of 11.8 area measuring up to 3.5 cm.

LEFT rib lesion with a maximum SUV of 9.2 (image 86/4) LEFT fourth
rib. This does not show focal sclerosis.

(Image 161/4) focal sclerotic area in the L3 vertebral body with a
maximum SUV of 9.5.

Geographic heterogeneity with increased metabolic activity in L4
with a maximum SUV of 9.1.

Signs of RIGHT hemi sacral and LEFT iliac metastases. Bony
metastases in the LEFT iliac with radiotracer accumulation measuring
approximately 4.4 cm (image 181/4) maximum SUV of 14.5. Sclerosis in
the RIGHT iliac measuring up to 2.6 cm. Heterogeneous appearance of
RIGHT femoral neck without defined lesion spanning the entire
femoral neck on the RIGHT with a maximum SUV of 8.8. Scattered areas
of less extensive and less pronounced radiotracer accumulation seen
elsewhere, for instance in the T2 level and in the T9-T10 and T11
levels.

Diffuse heterogeneity of bone and numerous areas of sclerosis
without increased metabolic activity, for instance in the sternum in
general and in the sternal manubrium along the LEFT sternal
manubrium as an example. Background areas of focal sclerosis without
metabolic activity more pronounced than on the study Monday October, 2019.
IMPRESSION: Findings of widespread metastatic disease to bone, active metastatic
foci with other areas of sclerosis without radiotracer accumulation
and in general increased number of sclerotic lesions and generalized
heterogeneity since previous imaging available from 1717. No signs
of nodal or visceral metastases.

Hepatic steatosis.

Calcified aortic atherosclerosis.

Aortic Atherosclerosis ([id number]).

## 2024-01-01 IMAGING — CR DG HIP (WITH OR WITHOUT PELVIS) 2-3V*R*
3 series · 3 of 3 positions shown · non-contrast
Comparison: September 17, 2019

CLINICAL DATA: Right posterior hip pain for 2 weeks.

History of metastatic to bone prostate cancer.
EXAM:
DG HIP (WITH OR WITHOUT PELVIS) 2-3V RIGHT

[pelvis ap]
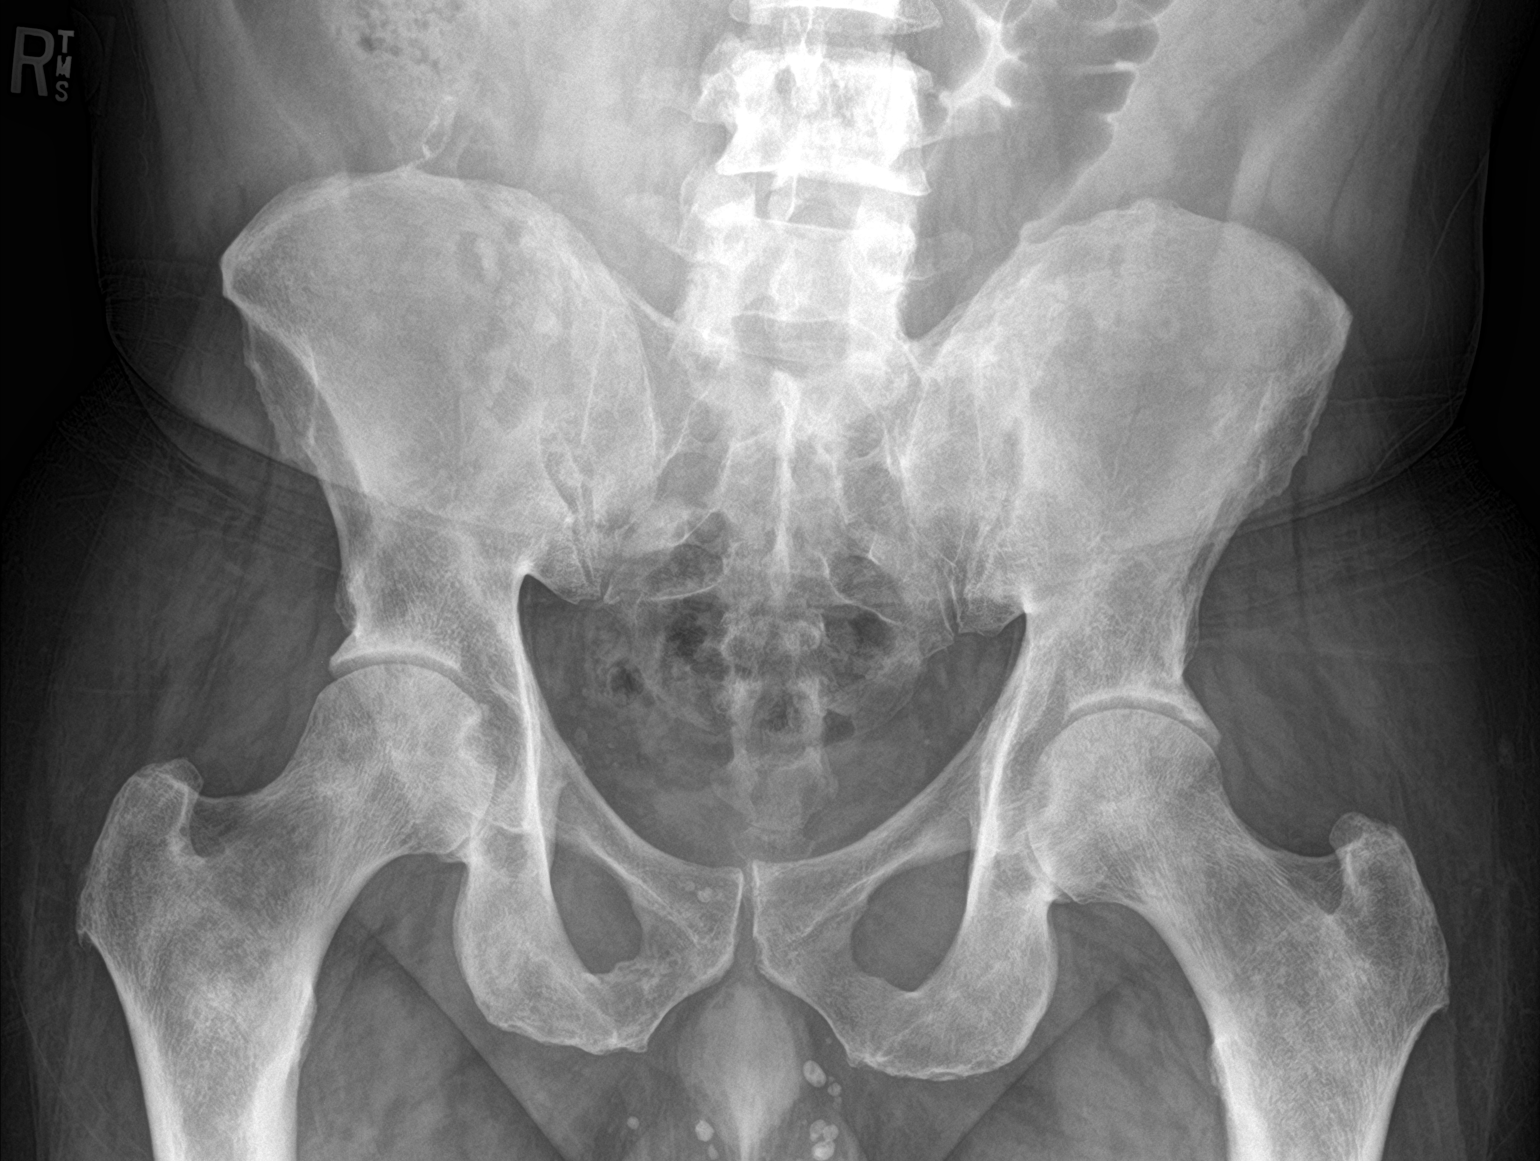

[hip ap]
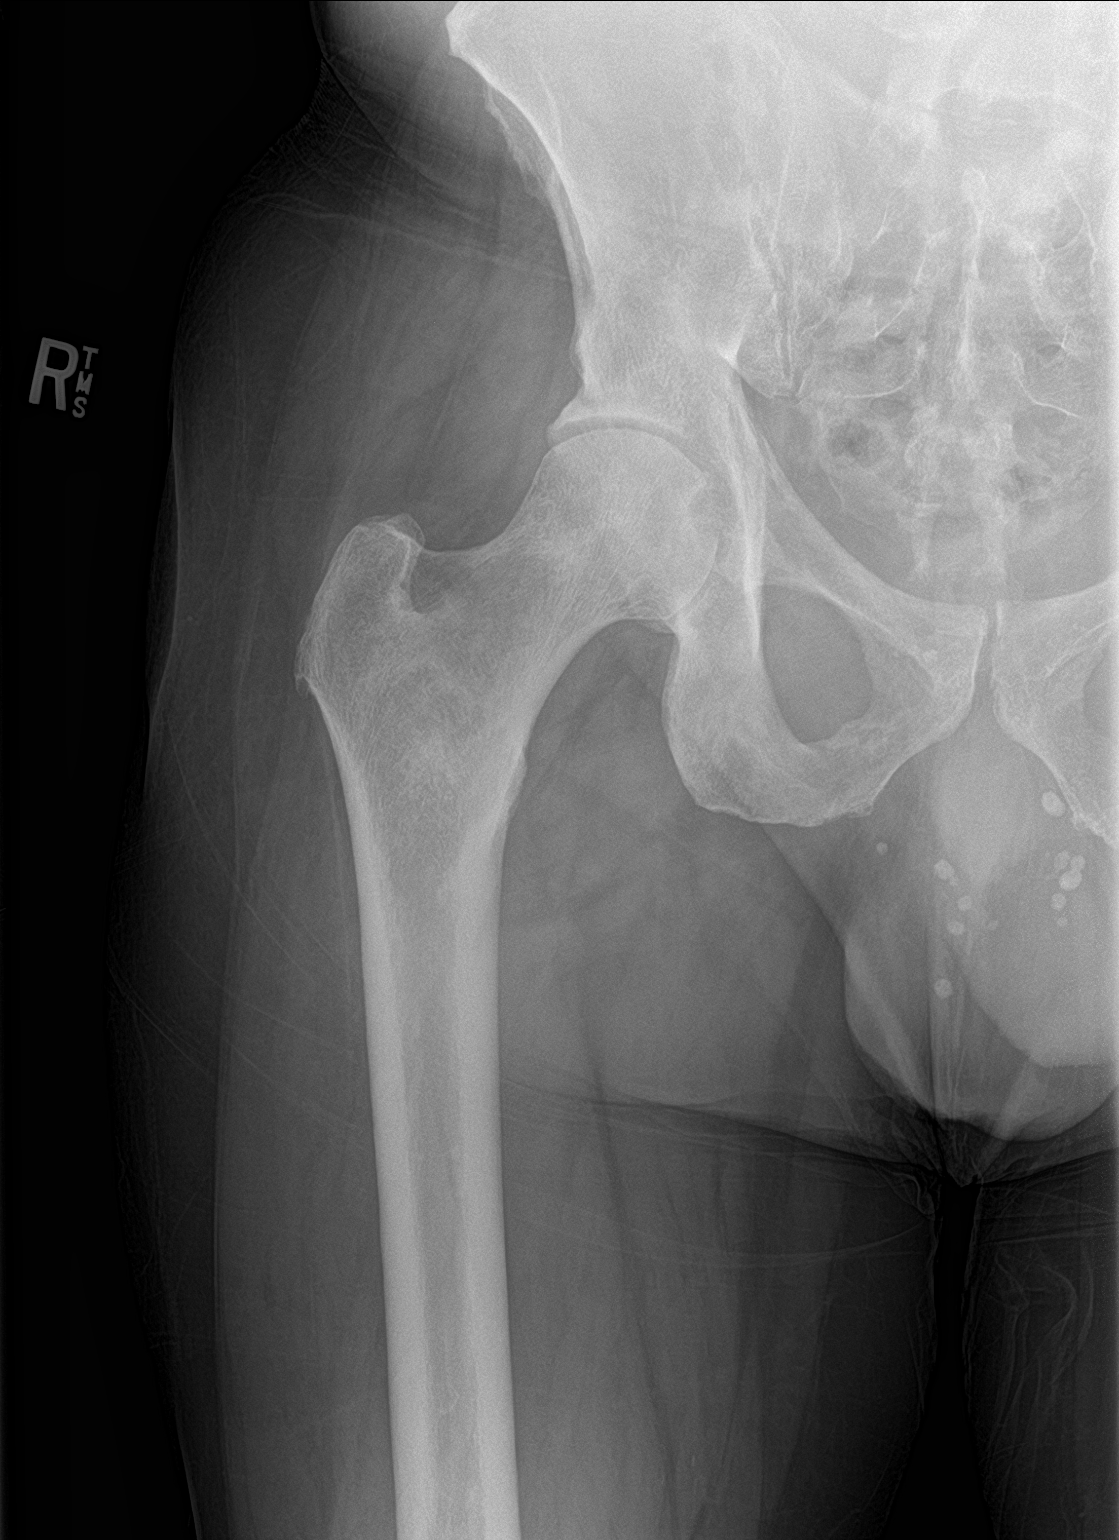

[hip lat]
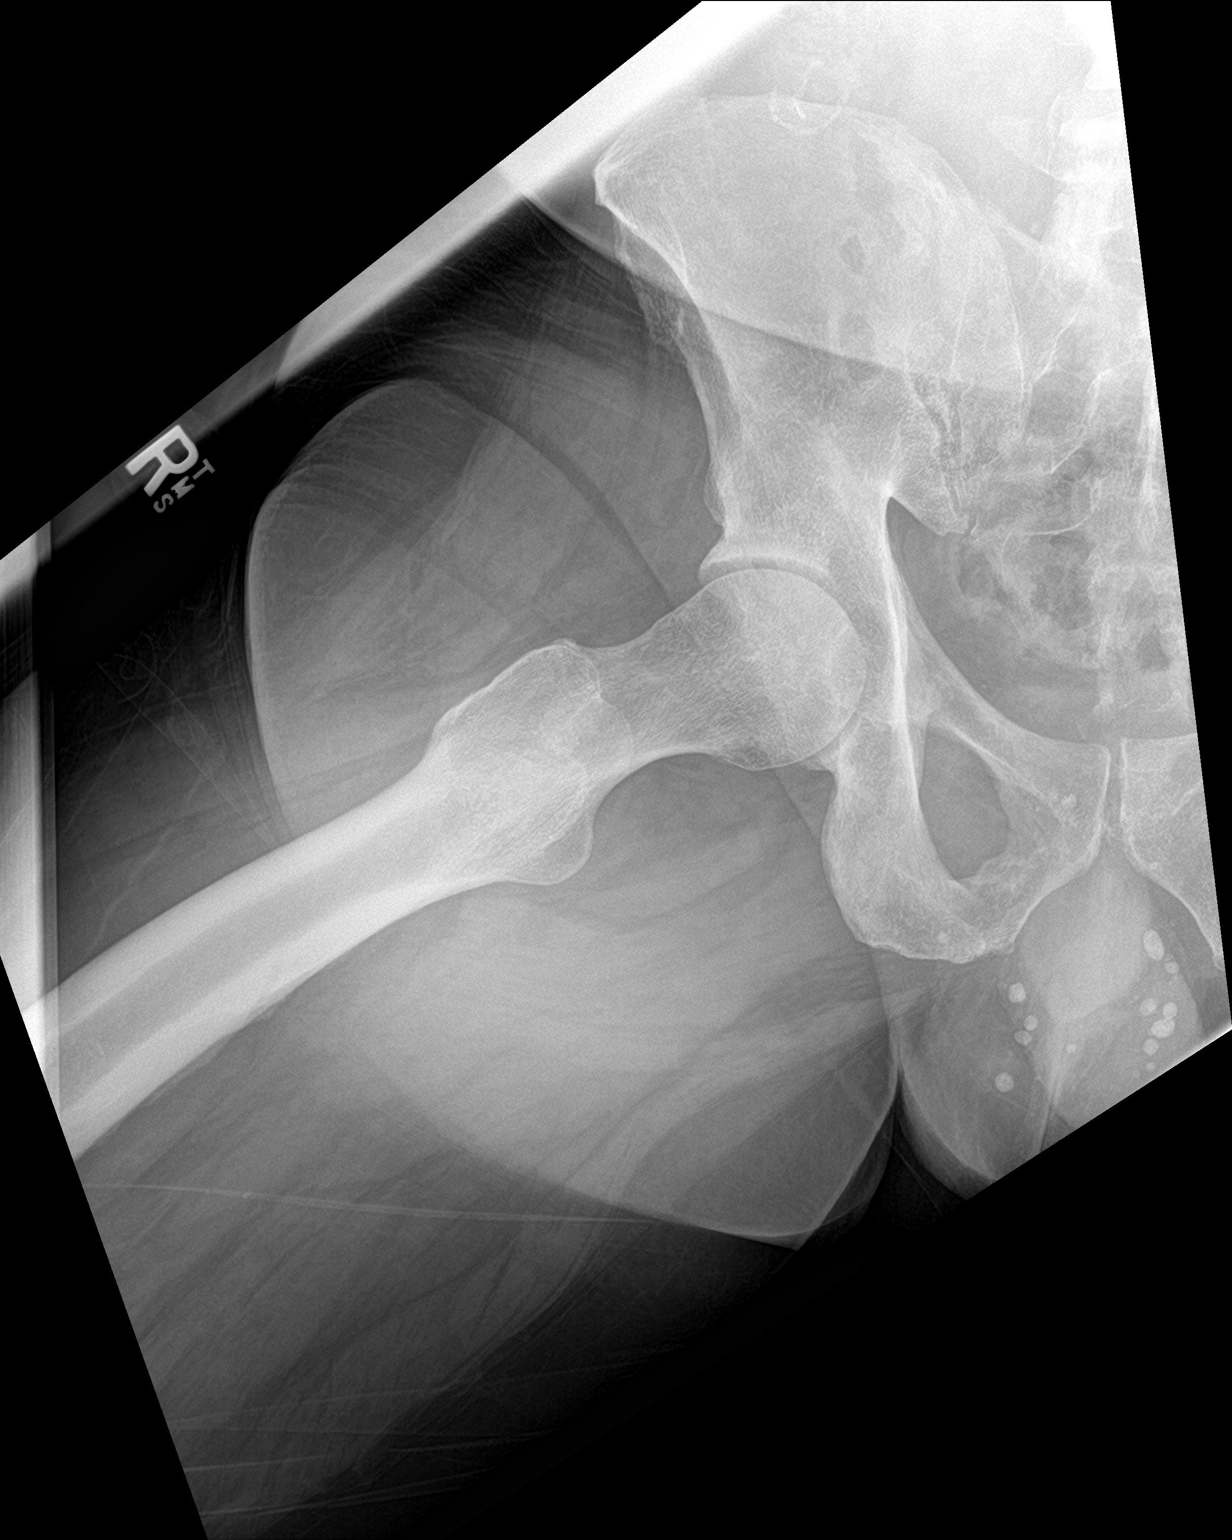

[3 of 3 positions shown; findings below may reference images not displayed]

FINDINGS: There is no evidence of hip fracture or dislocation. Heterogeneous
appearance of the bilateral iliac bones, right sacrum, right
inferior pubic ramus, right proximal femur. Sclerotic appearance of
segments of L4 and L5 vertebral bodies.
IMPRESSION: 1. No acute fracture or dislocation identified about the right hip.
2. Heterogeneous appearance of the bilateral iliac bones, right
sacrum, right inferior pubic ramus, right proximal femur, which in
correlation with patient's prior studies represent foci of
metastatic disease.
